# Patient Record
Sex: Female | Born: 1955 | Race: White | Hispanic: No | Marital: Married | Smoking: Former smoker
Health system: Southern US, Community
[De-identification: ages and names within clinical notes are randomized; demographics above are authoritative.]

## PROBLEM LIST (undated history)

## (undated) DIAGNOSIS — J45909 Unspecified asthma, uncomplicated: Secondary | ICD-10-CM

## (undated) DIAGNOSIS — F329 Major depressive disorder, single episode, unspecified: Secondary | ICD-10-CM

## (undated) DIAGNOSIS — I10 Essential (primary) hypertension: Secondary | ICD-10-CM

## (undated) DIAGNOSIS — F32A Depression, unspecified: Secondary | ICD-10-CM

## (undated) HISTORY — DX: Major depressive disorder, single episode, unspecified: F32.9

## (undated) HISTORY — DX: Unspecified asthma, uncomplicated: J45.909

## (undated) HISTORY — DX: Depression, unspecified: F32.A

## (undated) HISTORY — DX: Essential (primary) hypertension: I10

---

## 1998-05-01 ENCOUNTER — Emergency Department (HOSPITAL_COMMUNITY): Admission: EM | Admit: 1998-05-01 | Discharge: 1998-05-01 | Payer: Self-pay | Admitting: Emergency Medicine

## 2003-03-03 ENCOUNTER — Ambulatory Visit (HOSPITAL_COMMUNITY): Admission: RE | Admit: 2003-03-03 | Discharge: 2003-03-03 | Payer: Self-pay | Admitting: *Deleted

## 2003-03-03 ENCOUNTER — Encounter: Payer: Self-pay | Admitting: Family Medicine

## 2003-03-09 ENCOUNTER — Encounter: Payer: Self-pay | Admitting: Family Medicine

## 2003-03-09 ENCOUNTER — Ambulatory Visit (HOSPITAL_COMMUNITY): Admission: RE | Admit: 2003-03-09 | Discharge: 2003-03-09 | Payer: Self-pay | Admitting: Family Medicine

## 2003-05-01 ENCOUNTER — Encounter: Admission: RE | Admit: 2003-05-01 | Discharge: 2003-05-01 | Payer: Self-pay | Admitting: Family Medicine

## 2003-05-01 ENCOUNTER — Encounter: Payer: Self-pay | Admitting: Family Medicine

## 2005-03-28 ENCOUNTER — Ambulatory Visit: Payer: Self-pay | Admitting: Family Medicine

## 2005-04-13 ENCOUNTER — Ambulatory Visit: Payer: Self-pay | Admitting: Psychiatry

## 2005-05-01 ENCOUNTER — Ambulatory Visit (HOSPITAL_COMMUNITY): Admission: RE | Admit: 2005-05-01 | Discharge: 2005-05-01 | Payer: Self-pay | Admitting: Family Medicine

## 2005-05-03 ENCOUNTER — Ambulatory Visit: Payer: Self-pay | Admitting: Family Medicine

## 2005-05-09 ENCOUNTER — Ambulatory Visit (HOSPITAL_COMMUNITY): Admission: RE | Admit: 2005-05-09 | Discharge: 2005-05-09 | Payer: Self-pay | Admitting: Family Medicine

## 2005-05-17 ENCOUNTER — Ambulatory Visit: Payer: Self-pay | Admitting: Orthopedic Surgery

## 2005-05-30 ENCOUNTER — Ambulatory Visit: Payer: Self-pay | Admitting: Psychiatry

## 2005-06-14 ENCOUNTER — Ambulatory Visit: Payer: Self-pay | Admitting: Orthopedic Surgery

## 2005-08-07 ENCOUNTER — Inpatient Hospital Stay (HOSPITAL_COMMUNITY): Admission: RE | Admit: 2005-08-07 | Discharge: 2005-08-10 | Payer: Self-pay | Admitting: Orthopedic Surgery

## 2005-08-22 ENCOUNTER — Emergency Department (HOSPITAL_COMMUNITY): Admission: EM | Admit: 2005-08-22 | Discharge: 2005-08-22 | Payer: Self-pay | Admitting: Emergency Medicine

## 2005-09-15 ENCOUNTER — Ambulatory Visit: Payer: Self-pay | Admitting: Family Medicine

## 2005-09-26 ENCOUNTER — Emergency Department (HOSPITAL_COMMUNITY): Admission: EM | Admit: 2005-09-26 | Discharge: 2005-09-26 | Payer: Self-pay | Admitting: Emergency Medicine

## 2005-10-09 ENCOUNTER — Ambulatory Visit: Payer: Self-pay | Admitting: Family Medicine

## 2005-10-12 ENCOUNTER — Ambulatory Visit (HOSPITAL_COMMUNITY): Admission: RE | Admit: 2005-10-12 | Discharge: 2005-10-12 | Payer: Self-pay | Admitting: Family Medicine

## 2006-02-13 ENCOUNTER — Ambulatory Visit: Payer: Self-pay | Admitting: Family Medicine

## 2006-03-23 ENCOUNTER — Encounter (INDEPENDENT_AMBULATORY_CARE_PROVIDER_SITE_OTHER): Payer: Self-pay | Admitting: Family Medicine

## 2006-08-24 ENCOUNTER — Ambulatory Visit: Payer: Self-pay | Admitting: Family Medicine

## 2006-09-11 ENCOUNTER — Ambulatory Visit: Payer: Self-pay | Admitting: Family Medicine

## 2006-12-11 HISTORY — PX: REPLACEMENT TOTAL KNEE: SUR1224

## 2007-01-14 ENCOUNTER — Encounter: Payer: Self-pay | Admitting: Family Medicine

## 2007-01-14 DIAGNOSIS — E669 Obesity, unspecified: Secondary | ICD-10-CM | POA: Insufficient documentation

## 2007-01-14 DIAGNOSIS — F909 Attention-deficit hyperactivity disorder, unspecified type: Secondary | ICD-10-CM | POA: Insufficient documentation

## 2007-01-14 DIAGNOSIS — I10 Essential (primary) hypertension: Secondary | ICD-10-CM | POA: Insufficient documentation

## 2007-01-14 DIAGNOSIS — M25569 Pain in unspecified knee: Secondary | ICD-10-CM

## 2007-02-06 ENCOUNTER — Ambulatory Visit: Payer: Self-pay | Admitting: Family Medicine

## 2007-12-12 ENCOUNTER — Encounter: Payer: Self-pay | Admitting: Family Medicine

## 2007-12-26 ENCOUNTER — Ambulatory Visit: Payer: Self-pay | Admitting: Family Medicine

## 2008-01-02 ENCOUNTER — Ambulatory Visit (HOSPITAL_COMMUNITY): Admission: RE | Admit: 2008-01-02 | Discharge: 2008-01-02 | Payer: Self-pay | Admitting: Family Medicine

## 2008-03-06 ENCOUNTER — Encounter (HOSPITAL_COMMUNITY): Payer: Self-pay | Admitting: Obstetrics and Gynecology

## 2008-03-06 ENCOUNTER — Ambulatory Visit (HOSPITAL_COMMUNITY): Admission: RE | Admit: 2008-03-06 | Discharge: 2008-03-06 | Payer: Self-pay | Admitting: Obstetrics and Gynecology

## 2008-10-23 ENCOUNTER — Emergency Department (HOSPITAL_COMMUNITY): Admission: EM | Admit: 2008-10-23 | Discharge: 2008-10-23 | Payer: Self-pay | Admitting: Family Medicine

## 2008-10-27 ENCOUNTER — Inpatient Hospital Stay (HOSPITAL_COMMUNITY): Admission: EM | Admit: 2008-10-27 | Discharge: 2008-10-30 | Payer: Self-pay | Admitting: Emergency Medicine

## 2008-10-30 ENCOUNTER — Encounter: Payer: Self-pay | Admitting: Family Medicine

## 2008-11-20 ENCOUNTER — Encounter: Payer: Self-pay | Admitting: Family Medicine

## 2008-11-23 ENCOUNTER — Ambulatory Visit: Payer: Self-pay | Admitting: Family Medicine

## 2008-11-23 DIAGNOSIS — J320 Chronic maxillary sinusitis: Secondary | ICD-10-CM

## 2008-11-27 ENCOUNTER — Encounter: Payer: Self-pay | Admitting: Family Medicine

## 2009-04-06 ENCOUNTER — Encounter: Payer: Self-pay | Admitting: Family Medicine

## 2010-05-04 ENCOUNTER — Encounter (HOSPITAL_COMMUNITY): Admission: RE | Admit: 2010-05-04 | Discharge: 2010-06-03 | Payer: Self-pay | Admitting: Orthopedic Surgery

## 2010-12-31 ENCOUNTER — Encounter: Payer: Self-pay | Admitting: Family Medicine

## 2011-01-01 ENCOUNTER — Encounter: Payer: Self-pay | Admitting: Family Medicine

## 2011-01-10 NOTE — Letter (Signed)
Summary: misc.  misc.   Imported By: Curtis Sites 05/27/2010 13:15:02  _____________________________________________________________________  External Attachment:    Type:   Image     Comment:   External Document

## 2011-01-10 NOTE — Letter (Signed)
Summary: history and physical  history and physical   Imported By: Curtis Sites 05/27/2010 13:14:23  _____________________________________________________________________  External Attachment:    Type:   Image     Comment:   External Document

## 2011-01-10 NOTE — Letter (Signed)
Summary: demographic  demographic   Imported By: Curtis Sites 05/27/2010 13:14:00  _____________________________________________________________________  External Attachment:    Type:   Image     Comment:   External Document

## 2011-01-10 NOTE — Letter (Signed)
Summary: phone notes  phone notes   Imported By: Curtis Sites 05/27/2010 13:15:22  _____________________________________________________________________  External Attachment:    Type:   Image     Comment:   External Document

## 2011-01-10 NOTE — Letter (Signed)
Summary: consult  consult   Imported By: Curtis Sites 05/27/2010 13:12:26  _____________________________________________________________________  External Attachment:    Type:   Image     Comment:   External Document

## 2011-01-10 NOTE — Letter (Signed)
Summary: lab  lab   Imported By: Curtis Sites 05/27/2010 13:14:42  _____________________________________________________________________  External Attachment:    Type:   Image     Comment:   External Document

## 2011-01-10 NOTE — Letter (Signed)
Summary: progress notes  progress notes   Imported By: Curtis Sites 05/27/2010 13:17:16  _____________________________________________________________________  External Attachment:    Type:   Image     Comment:   External Document

## 2011-01-10 NOTE — Letter (Signed)
Summary: xray  xray   Imported By: Curtis Sites 05/27/2010 13:17:36  _____________________________________________________________________  External Attachment:    Type:   Image     Comment:   External Document

## 2011-04-25 NOTE — Op Note (Signed)
NAMESKYLLAR, [name]              ACCOUNT NO.:  [id number]   MEDICAL RECORD NO.:  [id number]          PATIENT TYPE:  AMB   LOCATION:  SDC                           FACILITY:  [facility]   PHYSICIAN:  Zelphia Cairo, MD    DATE OF BIRTH:  [birth year]   DATE OF PROCEDURE:  03/06/2008  DATE OF DISCHARGE:                               OPERATIVE REPORT   PREOPERATIVE DIAGNOSIS:  Abnormal uterine bleeding, endometrial mass.   POSTOPERATIVE DIAGNOSIS:  Abnormal uterine bleeding, endometrial mass.  Path pending.   PROCEDURE:  Hysteroscopy, D&C.   SURGEON:  Zelphia Cairo, MD.   ANESTHESIA:  General.   SPECIMEN:  Endometrial curettings, polyp.   FLUID DEFICIT:  10 mL.   ESTIMATED BLOOD LOSS:  Minimal.   COMPLICATIONS:  None.   CONDITION:  Stable to recovery room.   PROCEDURE:  The patient was taken to the operating room, where general  anesthesia was found to be adequate.  She was placed in the dorsal  lithotomy position using Allen stirrups.  She was prepped and draped in  a sterile fashion and an in-and-out catheter was used to drain her  bladder for approximately 20 mL of clear urine.  A speculum was placed  in the vagina and a single-tooth tenaculum was placed on the anterior  lip of the cervix.  The cervix was serially dilated with Shawnie Pons dilators.  The diagnostic hysteroscope was inserted into the uterine cavity and a  survey was performed.  Bilateral ostia appeared normal.  There was a  polypoid mass on the left uterine wall.  The remainder of the  endometrial cavity appeared atrophic without lesions or masses.  The  hysteroscope was then removed and a gentle curetting was performed.  The  hysteroscope was reinserted.  The endometrial mass was found to be  absent.  The single-tooth tenaculum and speculum were removed and the  patient was taken to the recovery room.      Zelphia Cairo, MD  Electronically Signed     GA/MEDQ  D:  03/06/2008  T:  03/06/2008  Job:   [id number]

## 2011-04-25 NOTE — H&P (Signed)
NAME:  YACHET, MATTSON NO.:  [id number]   MEDICAL RECORD NO.:  [id number]          PATIENT TYPE:  AMB   LOCATION:  SDC                           FACILITY:  [facility]   PHYSICIAN:  Zelphia Cairo, MD    DATE OF BIRTH:  [birth year]   DATE OF ADMISSION:  DATE OF DISCHARGE:                              HISTORY & PHYSICAL   A 55 year old female who presented to the office with complaints of  postmenopausal bleeding.  She had not had a period for 3 years until  January 2009, when she had vaginal bleeding lasting 5 days that was very  heavy and associated with cramping.  In 2005, she stopped her menstrual  cycles and started having hot flashes and night sweats.  She was not  started on hormone replacement therapy.  Further evaluation in the  office with sonohystogram reveals normal appearing ovaries, a 2 cm  subserosal fibroid, and a 2.8 cm polypoid mass within the uterine  cavity.   PAST MEDICAL HISTORY:  1. History of hepatitis A.  2. Irritable bowel syndrome.  3. Arthritis.  4. Hypertension.   SURGICAL HISTORY:  Left knee replacement in 2007.   MEDICATIONS:  Include Strattera and Paxil.   ALLERGIES:  PENICILLIN,   SOCIAL HISTORY:  Includes smoking 1/4 pack a day and occasional social  alcohol use.  She is married, and her primary physician is Dr. Syliva Overman.   FAMILY HISTORY:  Negative for gyn, colon, and breast cancer.   PHYSICAL EXAMINATION:  VITAL SIGNS:  Height 5 feet 2 inches, weight 256,  blood pressure 140/72, hemoglobin 13.4.  HEAD/NECK:  Normal.  No thyromegaly or nodularity.  HEART:  Regular rate and rhythm.  LUNGS:  Clear bilaterally.  ABDOMEN:  Soft, obese, nontender.  PELVIC:  Shows normal external female genitalia and urethral meatus.  Vagina and cervix are normal.  No lesions.  Uterus is mobile and  nontender.  No adnexal masses, but bimanual exam is compromised by body  habitus.  EXTREMITIES:  Without clubbing, cyanosis, or edema.   ASSESSMENT AND PLAN:  Postmenopausal bleeding, with endometrial mass  suspected by sonohystogram.  Our plan is for an hysteroscopy, D&C, with  resection of endometrial mass.      Zelphia Cairo, MD  Electronically Signed     GA/MEDQ  D:  03/05/2008  T:  03/06/2008  Job:  [id number]

## 2011-04-25 NOTE — Discharge Summary (Signed)
[name], Erin NO.:  Velez   MEDICAL RECORD NO.:  [id number]          PATIENT TYPE:  INP   LOCATION:  6739                         FACILITY:  [facility]   PHYSICIAN:  Erin Ramus, MD       DATE OF BIRTH:  [birth year]   DATE OF ADMISSION:  10/27/2008  DATE OF DISCHARGE:  10/30/2008                               DISCHARGE SUMMARY   PRIMARY CARE DOCTOR:  Milus Mallick. Lodema Hong, MD   PRIMARY DISCHARGE DIAGNOSIS:  Asthma exacerbation.   SECONDARY DIAGNOSES:  1. Community-acquired pneumonia.  2. Hypertension.  3. Metabolic syndrome with elevated blood glucose from prednisone, but      no evidence of diabetes.  4. Obesity.  5. Irritable bowel syndrome.  6. Anemia.  7. Transient hyponatremia and hyperkalemia.   HOSPITAL COURSE:  1. Asthma exacerbation.  The patient is a 55 year old female who was      admitted secondary to increasing cough and shortness of breath.      The patient is status post antibiotic treatment as an outpatient.      She did have persistent bilateral wheezes and she was placed on      prednisone with rapid taper.  She was given inhalers.  Her symptoms      have improved dramatically.  She was co-treated for pneumonia.  She      will be on antibiotics for another 5 days.  In fact her chest x-ray      did show evidence of lobular pneumonia.  The patient will require      oxygen at discharge until her symptoms have completely resolved.      The patient did have an 88% sat with ambulation in the hospital and      will be sent home with home O2.  2. Hypertension.  This is stable.  The patient is currently on      enalapril, this should be continue postdischarge.  Because of her      electrolyte abnormalities, I do not recommend continue in diuretic      therapy.  3. Transient hyponatremia and hyperkalemia.  This is more likely      secondary to diuretic usage and this has been discontinued.  4. Irritable bowel syndrome.  This has been stable  throughout this      admission.  5. Obesity.  The patient has a normal TSH and free T4 and she has been      counseled in respect of weight loss.  6. Anemia.  The patient did have rather profound anemia with a      hemoglobin of 7.7.  She received 2 units of blood in transfusion.      Her hemoglobin and hematocrit have been stable x2 days and believe      that no further workup of this is needed.  The patient did have      iron studies done.  She did have an active retic count and has      adequate iron for erythropoiesis and does not require iron      supplements, more than  likely this is secondary to postmenopausal      bleeding and endometrial hyperplasia for which she has been worked      up in the past.  7. Hyperglycemia.  The patient did have hemoglobin A1c checked, but      had no evidence of overt diabetes; however, given her weight, she      is more than likely suffering from metabolic syndrome and this will      need to be monitored by her primary care physician.   LABS OF NOTE:  1. Initial leukocytosis of 14.4, decreasing to 10.39, and then after      initiation of steroid, she did have rebound leukocytosis at 12.4.  2. Initial hemoglobin was 9.9, hematocrit 29.  This decreased to      hemoglobin of 7.7, hematocrit of 23.7 after IV fluids.  She did      receive 2 units of packed RBCs with hemoglobin of 9.5 and a      subsequent hemoglobin of 10.1.  The patient has shown no signs of      active bleeding post.  3. Platelets of 403.  4. Retic count of 3.1%.  5. Hyperkalemia with potassium of 6.1.  Her initial intake potassium      was 6.9.  I am recheck that to see if this persists.  Her initial      sodium was 134, this has been corrected to 138.  6. Glucose initially 142, increasing to 179 with steroids, but a      hemoglobin A1c of 5.8.  7. CK of 363 with a troponins 0.02 and 0.01 respectively.  The patient      did have episode of chest pain.  This prompted the CK and  troponin      check.  She also had two negative EKGs.  8. TSH of 1.66 with a free T4 of 0.97.  9. Iron studies showing a ferritin of 608 with a serum iron of 39.  10.UA showing initially concentrated urine with specific gravity      greater than 1.028, decreasing to 1.018.  Otherwise, no evidence of      infection.   STUDIES:  Chest x-ray showing probable right lower lobe pneumonia.   MEDICATIONS ON DISCHARGE:  1. Paxil 20 mg p.o. daily.  2. Vaseretic 10/25, which has been discontinued.  3. Ciprofloxacin 500 mg one p.o. b.i.d. x5 days.  4. Prednisone 40 mg p.o. daily x3 days, 20 mg p.o. daily x3 days, 10      mg p.o. daily x3 days.  5. Albuterol 1-2 puffs inhale q.6 h. p.r.n. wheezes.  6. Vicodin 5/325 one to two p.o. q.6 h. p.r.n. pain.  7. Home O2 at 2 liters per minute to maintain sats greater than 90%.   There is one lab pending, but this will be reviewed prior to discharge  in regards to her potassium.  Otherwise, she has no labs or studies  pending at time of discharge.  The patient is in stable condition and  anxious for discharge.  Time spent 35 minutes.      Erin Ramus, MD  Electronically Signed     JF/MEDQ  D:  10/30/2008  T:  10/30/2008  Job:  [id number]   cc:   Milus Mallick. Lodema Hong, M.D.

## 2011-04-25 NOTE — H&P (Signed)
[name], [name]              ACCOUNT NO.:  [id number]   MEDICAL RECORD NO.:  [id number]          PATIENT TYPE:  INP   LOCATION:  1825                         FACILITY:  [facility]   PHYSICIAN:  [name], M.D.   DATE OF BIRTH:  04/15/1956   DATE OF ADMISSION:  10/27/2008  DATE OF DISCHARGE:                              HISTORY & PHYSICAL   CHIEF COMPLAINT:  Coughing and shortness of breath.   HISTORY OF PRESENT ILLNESS:  The patient is a 55 year old white female  that presented at the emergency room secondary to cough and shortness of  breath.  The patient stated that five days ago she was feeling bad.  She  went to her primary care doctor, Dr. Lodema Velez.  She was given a Z-pack.  She stated that she was not feeling better.  She came here to the  emergency room.  She was given another Z-pack.  She continues not to  feel well, so she came back to the emergency room today.  She states  that she continues to cough, she continues to run low grade fever, and  she coughed up x1 blood-tinged sputum.  Nausea and vomiting x1.  She  states aching all over.  She is having chills.  She states that she  feels a little better since being in the emergency room.   PAST MEDICAL HISTORY:  1. Significant for irritable bowel syndrome.  2. Arthritis.  3. Obesity.  4. Hypertension.   FAMILY HISTORY:  Her father died of multiple myeloma.  He was 72.  Her  mother had fibromyalgia, arthritis, and high cholesterol.   SOCIAL HISTORY:  The patient is married.  She has two children.  She  used to smoke half a pack per day but quit for the past month.  She  drinks socially.   MEDICATIONS:  1/  She takes a blood pressure medication.  1. Paxil 20 mg daily.   ALLERGIES:  PENICILLIN.   REVIEW OF SYSTEMS:  Negative, otherwise as in HPI.   PHYSICAL EXAMINATION:  VITAL SIGNS:  Temperature 100.5, blood pressure  144/81, pulse 99, respirations 22, pulse ox 95% on room air.  GENERAL:  The patient is  sitting up on the stretcher, does not seem to  be in any acute distress.  She is coughing.  HEENT:  Normocephalic, atraumatic.  Pupils equal, round, and reactive to  light without erythema.  CARDIOVASCULAR:  She is regular rate and rhythm.  LUNGS:  Mostly clear bilaterally.  ABDOMEN:  Soft, obese, positive bowel sounds.  EXTREMITIES:  Without edema, 2+ DP pulses bilaterally.  Her right lower  extremity looks a little muscled, but she does have good cap refill and  good posterior tibial pulse.   LABORATORY DATA:  Sodium 134, potassium 6.9, chloride 105, BUN 30,  creatinine 0.6, glucose 142.  Hemoglobin 9.9. hematocrit 29.   Chest x-ray:  Probably right lower lobe pneumonia.   ASSESSMENT/PLAN:  1. Pneumonia.  2. Hypertension.  3. Hyponatremia.  4. Hyperkalemia.  5. Irritable bowel syndrome.  6. Arthritis.  7. Obesity.   Will admit the patient to the  hospital, IV fluids, normal saline at 50  an hour, put on __________precautions.  Start Rocephin, azithromycin,  and Tamiflu.  Rectify blood pressure with p.r.n. Lopressor until her  family brings her medication in.  Will continue Paxil.  Will check EKG  with stat repeat of her potassium level.  Will check guaiac stool x3 for  her anemia.  Will check CMP and CBC.  If her potassium is high, then  will treat her hyperkalemia, also have her on tele secondary to the  potassium being elevated on her labs.      [name], M.D.  Electronically Signed     NJ/MEDQ  D:  10/27/2008  T:  10/27/2008  Job:  [id number]   cc:   [name]. [name], M.D.

## 2011-04-28 NOTE — Op Note (Signed)
[name], GARDINER              ACCOUNT NO.:  [id number]   MEDICAL RECORD NO.:  [id number]          PATIENT TYPE:  AMB   LOCATION:  DFTL                         FACILITY:  [facility]   PHYSICIAN:  Mila Homer. Sherlean Foot, M.D. DATE OF BIRTH:  19-Feb-1956   DATE OF PROCEDURE:  08/07/2005  DATE OF DISCHARGE:                                 OPERATIVE REPORT   PREOPERATIVE DIAGNOSIS:  Left knee osteoarthritis.   POSTOPERATIVE DIAGNOSIS:  Left knee osteoarthritis.   OPERATION PERFORMED:  Left total knee arthroplasty with computer assistance.   SURGEON:  Mila Homer. Sherlean Foot, M.D.   ASSISTANT:  Legrand Pitts. Duffy, P.A.   ANESTHESIA:  General.   INDICATIONS FOR PROCEDURE:  The patient is a 55 year old white female with  failure of conservative measures for osteoarthritis of the knee.  Informed  consent was obtained.   DESCRIPTION OF PROCEDURE:  The patient was laid supine and administered  general anesthesia and preoperative femoral nerve block.  The left lower  extremity was prepped and draped in the usual sterile fashion.  The #10  blade was used to make a midline incision and parapatellar arthrotomy.  The  deep MCL was elevated off the medial crest of the tibia.  Synovectomy was  performed.  I then went into flexion, released the ACL and PCL. I everted  the patella in extension, measured it 22 mm thick, reamed down to 13 mm with  a reamer, removed excess bone, drilled three lug holes with the 29 template  and with the 29 prosthesis in place, it was 22 mm as well.  I removed the  trial prosthesis and at this point inserted my computer assistance pins.  Then I calibrated the computer.  I then set up the extramedullary alignment  guide on the tibia once I had pinned it into place where I thought would  make a good cut, I put the computer assisted angel wing in the slot and it  confirmed an absolute 0.0 degree varus valgus cut in 5 degrees of flexion.  I made the cut with a sagittal saw and removed  the extramedullary guide and  the bone piece.  I then made an intramedullary drill hole in the femur, put  the distal femoral cutting block set on 6 degree valgus.  I pinned it into  place, then put the computer assisted angel wing in the slot and it also  verified a 0.1 valgus cut in about 5 degrees of flexion.  I then made this  cut with a sagittal saw.  I then drilled out the epicondylar axis and after  measuring a size D femoral component, pinning it place, verified that we  were doing 3 degrees in external rotation to the epicondylar axis.  I then  made the anterior posterior and chamfer cuts with the 4 in 1 cutting block.  I then again verified this with the computerized angel wing.  I then turned  my attention to the tibia, used a lamina spreader and removed medial and  lateral menisci, posterior condylar osteophytes, ACL, PCL.  I then put the  12 mm spacer block  in place and with the computer, looked at my ligament  balance.  It showed a little bit more, a degree more laxity in the lateral  compartment and a degree in the medial compartment in extension.  I then  removed the computer pins and finished the femur with a size B finishing  block drill and saw, finished the tibia with a size 3 tibial tray drill and  keel.  Trial with a size D femur, size 3 tibia, size 12 insert and 29  patella.  I had flexion extension gap balance.  Drop and dangle was back to  125 degrees.  I then removed the trial components, copiously irrigated and  mixed the cement.  I then cemented in the components and removed excess  cement, let the tourniquet down at one hour and 10 minutes.  I then left the  Hemovac deep to the arthrotomy, closed the arthrotomy with interrupted  figure-of-eight #1 Vicryl sutures.  I then put in a Painbuster pain pump in  the subcuticular space and closed with interrupted 0 Vicryl sutures, running  2-0 Vicryl stitch and skin staples.   COMPLICATIONS:  None.   DRAINS:  One  Hemovac, one Painbuster pump.   DRESSING:  Adaptic, 4 x 4s, ABDs, sterile Webril and TED stocking.           ______________________________  Mila Homer. Sherlean Foot, M.D.     SDL/MEDQ  D:  08/07/2005  T:  08/07/2005  Job:  [id number]

## 2011-04-28 NOTE — H&P (Signed)
NAMESHAWNIECE, OYOLA              ACCOUNT NO.:  [id number]   MEDICAL RECORD NO.:  [id number]          PATIENT TYPE:  INP   LOCATION:  NA                           FACILITY:  [facility]   PHYSICIAN:  Mila Homer. Sherlean Foot, M.D. DATE OF BIRTH:  07-29-1956   DATE OF ADMISSION:  08/07/2005  DATE OF DISCHARGE:                                HISTORY & PHYSICAL   CHIEF COMPLAINT:  Bilateral knee pain.   HISTORY OF PRESENT ILLNESS:  The patient is a 55 year old white female with  a history of bilateral knee pain for many years.  Currently, the left knee  is significantly worse than the right.  Over the last several years, it has  progressively worsened but significantly worsened since November of 2005.  She has a very sharp, stabbing-type pain centrally within the knee that  worsens with ambulation and range of motion.  She states she is having to  drag her leg around due to pain and difficulty with moving it.  The pain  does radiate up and down the entire leg at times.  She does have swelling.  She does have significant grinding.  She is currently using a cane to assist  in ambulation.  X-rays show severe tricompartment OA, left knee.   ALLERGIES:  PENICILLIN.   CURRENT MEDICATIONS:  1.  Ultracet one to two every four to six hours.  2.  Paroxetine 20 mg p.o. daily.  3.  Hydrochlorothiazide 25 mg p.o. daily.   PAST MEDICAL HISTORY:  1.  Hypertension.  2.  Depression.  3.  Obesity.   PAST SURGICAL HISTORY:  Tonsillectomy at 55 years of age without any  complications.   SOCIAL HISTORY:  This is a 55 year old obese white female who denies any  history of smoking.  She indicates that she stopped drinking when she  started her antidepressant.  She is married and has several grown children.  She lives in a [place] house, and she is currently previously employed  with [employer].   PRIMARY CARE PHYSICIAN:  Milus Mallick. Lodema Hong, M.D.   FAMILY HISTORY:  Mother is alive with just  osteoarthritis.  Father is alive  with hypertension.  One brother alive with no significant medical history.  Two sisters alive, one with scleroderma.   REVIEW OF SYSTEMS:  Negative for all review of systems categories, including  general, sensory, respiratory, cardiac, GI, GU, hematologic,  musculoskeletal.   PHYSICAL EXAMINATION:  VITAL SIGNS:  Height 5 feet 3 inches, weight 220  pounds, blood pressure 138/90, pulse 80 and regular, respirations 12, and  the patient is afebrile.  GENERAL:  This is a 55 year old white female who appears to be significantly  obese.  She ambulates with a cane in her right hand and walks with a  significant limp with slow gait.  It is difficult for her to get on an off  of the exam table, but she appears to be in no significant distress.  HEENT:  Head was normocephalic.  Pupils equal, round, reactive to light and  accommodation.  Extraocular movements intact.  Oral buccal mucosa was pink  and  moist.  External ears without deformities.  Gross hearing intact.  NECK:  Supple.  No palpable lymphadenopathy.  Thyroid region was nontender.  She had good range of motion of her cervical spine.  CHEST:  Lung sounds were clear and equal bilaterally.  No wheezing, rhonchi,  or rales.  HEART:  Regular rate and rhythm.  No murmurs, rubs, or gallops.  ABDOMEN:  Round, obese, soft, nontender.  Normoactive bowel sounds.  No  hepatosplenomegaly palpable due to obesity.  CVA region nontender.  EXTREMITIES:  Upper extremities were symmetrical in size and shape, with  full range of motion of her shoulders, elbows, and wrists.  She had  Dupuytren's contractures in the little fingers of both hands.  Lower  extremities:  Right and left hip had full extension, flexion up to 130  degrees with 20 degrees internal and external rotation of the hips without  any mechanical symptoms or discomfort.  Right knee was without any signs of  erythema or ecchymosis.  She had no effusion at  this time.  She was  generally sore throughout the knee.  She had full extension.  She was able  to flex it back to about 100 degrees.  She had a slight valgus/varus laxity.  She had coarse crepitus on the knee.  No effusion.  The calf was soft and  nontender.  The left knee had no signs of erythema or ecchymosis but  possibly a trace effusion.  She was tender throughout.  She did have some  slight valgus/varus laxity.  She was able to slowly fully extend and slowly  flex it back to about 110 degrees.  She did have significant crepitus on the  patella.  The calf was soft and nontender.  The ankles with symmetric, with  good dorsiplantarflexion.  PERIPHERAL VASCULAR:  Carotid pulses were 2+, no bruits.  Radial pulses were  2+.  Dorsalis pedis pulses were 1+.  She had no lower extremity edema or  venostasis changes.  NEUROLOGIC:  The patient was conscious, alert, and appropriate.  __________  conversation __________.  She had no gross neurologic defects noted.  BREASTS, RECTAL, GU:  Exams deferred at this time.   IMPRESSION:  1.  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  2.  Hypertension.  3.  Depression.  4.  Obesity.   PLAN:  The patient will be admitted to [facility] on August 07, 2005 for a planned left total knee arthroplasty by Dr. Sherlean Foot.  The patient  will undergo all of the routine labs and tests prior to this surgical  procedure.      Jamelle Rushing, P.A.    ______________________________  Mila Homer. Sherlean Foot, M.D.    RWK/MEDQ  D:  07/27/2005  T:  07/27/2005  Job:  [id number]

## 2011-04-28 NOTE — Discharge Summary (Signed)
[name], [name]              ACCOUNT NO.:  [id number]   MEDICAL RECORD NO.:  [id number]          PATIENT TYPE:  INP   LOCATION:  5010                         FACILITY:  [facility]   PHYSICIAN:  Mila Homer. Sherlean Foot, M.D. DATE OF BIRTH:  Mar 07, 1956   DATE OF ADMISSION:  08/07/2005  DATE OF DISCHARGE:  08/10/2005                                 DISCHARGE SUMMARY   ADMISSION DIAGNOSES:  1.  Bilateral knee pain, currently left greater than right.  2.  Hypertension.  3.  Depression.  4.  Obesity.   DISCHARGE DIAGNOSES:  1.  Status post left total knee arthroplasty.  2.  Acute blood loss of anemia secondary to surgery, asymptomatic.  3.  Hypertension.  4.  Depression.  5.  Obesity.   HISTORY OF PRESENT ILLNESS:  [name] is a 55 year old white female with  history of bilateral knee pain for many years.  Currently the left knee is  significantly worse than the right.  Over the last several years the pain  has become gradually worse in the left knee.  She describes the pain as very  sharp, stabbing pain located centrally within the knee that worsens with  ambulation and range of motion.  She states she is having to drag her leg  around due to pain and has difficulty moving.  The pain radiates up and down  the entire leg and at times she uses a cane to ambulate.  X-rays of the left  knee show severe tricompartment loss due to arthritis.   ALLERGIES:  PENICILLIN.   MEDICATIONS:  1.  Ultracet 1-2 tablets every 4-6 hours for pain.  2.  __________ 20 mg p.o. daily.  3.  Hydrochlorothiazide 25 mg p.o. daily.   PROCEDURE:  The patient was taken to the operating room on August 07, 2005  by Dr. Georgena Spurling, assisted by Nathanial Rancher, P.A.  The patient was placed  under general anesthesia with a preoperative femoral nerve block.  The  patient then underwent a left total knee arthroplasty using the following  components:  A size D femur, a size 3 tibial component, 12 mm bearing and  then  all poly size 29,8.0 mm thickness.  The patient tolerated the procedure  well and returned to recovery in good, stable condition.   HOSPITAL COURSE:  On postoperative day #1 the patient had poor pain control.  Otherwise tolerating diet.  No chest pain, shortness of breath, nausea, or  vomiting.  T-max was 100.2.  Vital signs stable.  H&H 9.7/28.7.  OxyContin  10 mg sustained release was to be started after lunch if the patient  tolerated the diet well and a.c.  The patient's acute blood loss of anemia  was asymptomatic, so it was decided to be monitored.  On postoperative day  #2 the patient had better pain control.  Afebrile, vital signs stable.  H&H  dropped to 8.8 and 26.1.  The patient remained asymptomatic and this was to  be monitored.  On exam the patient's abdomen was distended and bowel sounds  were hypoactive.  KUB was ordered to rule out obstruction.  Postoperative  day #3 the patient was afebrile, vital signs stable.  H&H was 8.1/23.1.  Patient with no symptoms of anemia.  Abdominal exam revealed positive bowel  sounds.  The patient's abdomen was nontender, decreased distention.  KUB  showed colonic obstruction and constipation.  Dulcolax 10 mg p.r. x1 was  ordered.  The patient progressed well with physical therapy and it was felt  that she could be discharged later in the afternoon after BM.   LABORATORY DATA:  Routine labs on admission:  CBC:  All values within normal  limits.  Coags:  All values within normal limits.  Routine chemistries on  admission:  Sodium 140, potassium 4.5, chloride 105, bicarb 24, glucose 105,  BUN 12, creatinine 0.7, calcium 9.7.   Hepatic enzymes on admission:  All values within normal limits.  Urinalysis  on admission was negative.   Two view chest x-ray dated August 01, 2005 showed no active cardiopulmonary  disease.  Two view abdomen on August 09, 2005 showed colonic ileus and  constipation.   EKG on admission showed normal sinus rhythm.   PR interval was 138 msec, PRT  axis 33.9, __________ .   DISCHARGE INSTRUCTIONS:   MEDICATIONS:  The patient was to resume all home medications except for  Ultracet and add the following:  1.  Lovenox 40 mg one injection daily at 8 a.m. x11 days, __________ .  2.  OxyContin 10 mg one tablet every 12 hours p.r.n. pain.  3.  Dilaudid 4 mg one tablet every four hours for breakthrough pain.  4.  Robaxin 500 mg 1-2 tablets every 6-8 hours for spasm.  5.  Trinsicon one capsule three times daily x28 days.  6.  Pick up over the counter stool softener and laxative as needed.   DIET:  No restrictions.   ACTIVITY:  The patient is to be weight-bearing as tolerated with walker.   WOUND CARE:  The patient is to change the dressing and may shower after two  days if no drainage at the wound site.  The patient is to call the office at  [phone] if any signs of infection or pain is not under adequate control.   SPECIAL INSTRUCTIONS:  CPM 0-90 degrees 6-8 hours a day.  Increase by 10  degrees daily.  Home health PT per [facility].   FOLLOW UP:  The patient needs to follow up with Dr. Sherlean Foot approximately 11  days from day of surgery.  The patient is to call for appointment at [phone].  The patient is to follow up with primary care physician in 2-3 weeks  to check hemoglobin.  Primary care physician is Dr. Syliva Overman.      Richardean Canal, P.A.    ______________________________  Mila Homer. Sherlean Foot, M.D.    GC/MEDQ  D:  10/23/2005  T:  10/23/2005  Job:  [id number]   cc:   Mila Homer. Sherlean Foot, M.D.  Fax: [phone]   Milus Mallick. Lodema Hong, M.D.  Fax: [phone]

## 2011-09-04 LAB — BASIC METABOLIC PANEL
BUN: 11
CO2: 28
Calcium: 9.7
Chloride: 100
Creatinine, Ser: 0.43
GFR calc Af Amer: >60
GFR calc non Af Amer: >60
Glucose, Bld: 107 — ABNORMAL HIGH
Potassium: 4.2
Sodium: 136

## 2011-09-04 LAB — TYPE AND SCREEN: Antibody Screen: NEGATIVE

## 2011-09-04 LAB — CBC
HCT: 40.7
Hemoglobin: 13.9
MCHC: 34.3
MCV: 89.5
Platelets: 363
RBC: 4.55
RDW: 14.2
WBC: 11.7 — ABNORMAL HIGH

## 2011-09-12 LAB — COMPREHENSIVE METABOLIC PANEL
ALT: 17
ALT: 22
AST: 30
AST: 33
Alkaline Phosphatase: 95
BUN: 8
CO2: 24
CO2: 27
CO2: 28
Calcium: 8.5
Calcium: 9
Chloride: 107
Creatinine, Ser: 0.46
GFR calc Af Amer: >60
GFR calc Af Amer: >60
GFR calc non Af Amer: >60
GFR calc non Af Amer: >60
GFR calc non Af Amer: >60
Glucose, Bld: 118 — ABNORMAL HIGH
Potassium: 3.4 — ABNORMAL LOW
Potassium: 3.7
Sodium: 140
Sodium: 141

## 2011-09-12 LAB — CROSSMATCH: DAT, IgG: NEGATIVE

## 2011-09-12 LAB — CBC
HCT: 23.7 — ABNORMAL LOW
Hemoglobin: 10.1 — ABNORMAL LOW
Hemoglobin: 7.7 — CL
Hemoglobin: 9.1 — ABNORMAL LOW
Hemoglobin: 9.5 — ABNORMAL LOW
MCHC: 35.4
Platelets: 297
RBC: 2.61 — ABNORMAL LOW
RBC: 2.92 — ABNORMAL LOW
RDW: 13.9
RDW: 15.3
WBC: 16.8 — ABNORMAL HIGH

## 2011-09-12 LAB — URINALYSIS, ROUTINE W REFLEX MICROSCOPIC
Glucose, UA: 100 — AB
Hgb urine dipstick: NEGATIVE
Ketones, ur: NEGATIVE
Leukocytes, UA: NEGATIVE
Nitrite: NEGATIVE
Nitrite: NEGATIVE
Protein, ur: 30 — AB
Specific Gravity, Urine: 1.018
Urobilinogen, UA: 0.2
Urobilinogen, UA: 1
pH: 6

## 2011-09-12 LAB — BASIC METABOLIC PANEL
GFR calc Af Amer: >60
GFR calc non Af Amer: >60
Glucose, Bld: 187 — ABNORMAL HIGH
Potassium: 4.4
Sodium: 140

## 2011-09-12 LAB — RETICULOCYTES
RBC.: 2.92 — ABNORMAL LOW
Retic Count, Absolute: 91

## 2011-09-12 LAB — CULTURE, BLOOD (ROUTINE X 2): Culture: NO GROWTH

## 2011-09-12 LAB — HEMOGLOBIN A1C
Hgb A1c MFr Bld: 5.8
Mean Plasma Glucose: 120

## 2011-09-12 LAB — DIFFERENTIAL
Basophils Absolute: 0
Lymphocytes Relative: 9 — ABNORMAL LOW
Lymphs Abs: 1.5
Monocytes Absolute: 0.8
Neutro Abs: 14.4 — ABNORMAL HIGH

## 2011-09-12 LAB — POCT I-STAT, CHEM 8
BUN: 13
Calcium, Ion: 1.01 — ABNORMAL LOW
Chloride: 105
HCT: 29 — ABNORMAL LOW
Sodium: 134 — ABNORMAL LOW

## 2011-09-12 LAB — FOLATE: Folate: 10.6

## 2011-09-12 LAB — VITAMIN B12: Vitamin B-12: 381 (ref 211–911)

## 2011-09-12 LAB — FERRITIN: Ferritin: 608 — ABNORMAL HIGH (ref 10–291)

## 2011-09-12 LAB — HEMOGLOBIN AND HEMATOCRIT, BLOOD
HCT: 23.4 — ABNORMAL LOW
Hemoglobin: 8.1 — ABNORMAL LOW

## 2011-09-12 LAB — CARDIAC PANEL(CRET KIN+CKTOT+MB+TROPI): CK, MB: 4

## 2011-09-12 LAB — TSH: TSH: 1.662

## 2011-09-12 LAB — CK: Total CK: 363 — ABNORMAL HIGH

## 2011-09-12 LAB — IRON AND TIBC
Saturation Ratios: 16 — ABNORMAL LOW
TIBC: 250

## 2011-09-12 LAB — URINE MICROSCOPIC-ADD ON

## 2013-07-05 ENCOUNTER — Encounter: Payer: Self-pay | Admitting: *Deleted

## 2013-07-07 ENCOUNTER — Encounter: Payer: Self-pay | Admitting: Family Medicine

## 2013-07-07 ENCOUNTER — Ambulatory Visit (INDEPENDENT_AMBULATORY_CARE_PROVIDER_SITE_OTHER): Payer: BC Managed Care – PPO | Admitting: Family Medicine

## 2013-07-07 VITALS — BP 150/90 | HR 80 | Wt 255.0 lb

## 2013-07-07 DIAGNOSIS — E669 Obesity, unspecified: Secondary | ICD-10-CM

## 2013-07-07 DIAGNOSIS — M25569 Pain in unspecified knee: Secondary | ICD-10-CM

## 2013-07-07 DIAGNOSIS — I10 Essential (primary) hypertension: Secondary | ICD-10-CM

## 2013-07-07 DIAGNOSIS — F909 Attention-deficit hyperactivity disorder, unspecified type: Secondary | ICD-10-CM

## 2013-07-07 DIAGNOSIS — Z79899 Other long term (current) drug therapy: Secondary | ICD-10-CM

## 2013-07-07 DIAGNOSIS — M25561 Pain in right knee: Secondary | ICD-10-CM

## 2013-07-07 DIAGNOSIS — R5381 Other malaise: Secondary | ICD-10-CM

## 2013-07-07 MED ORDER — DICLOFENAC SODIUM 75 MG PO TBEC: 75.0000 mg | DELAYED_RELEASE_TABLET | Freq: Two times a day (BID) | ORAL | Status: DC

## 2013-07-07 MED ORDER — LOSARTAN POTASSIUM 50 MG PO TABS: 50.0000 mg | ORAL_TABLET | Freq: Every day | ORAL | Status: DC

## 2013-07-07 NOTE — Progress Notes (Signed)
  Subjective:    Patient ID: [name], female    DOB: 29-Aug-1956, 57 y.o.   MRN: [id number]  HPI Patient has not been seen for nearly 3 years. Was on blood pressure medicine. Stop enalapril because she thought it was making her urinate often. Not back on any bp mrd yet.  Knee pain terrible. Can not hardly walk. History of left knee replacement. Strong family history of arthritis.  Swells up with exercise. Worse with motion  Off anti depressant med doing ok with that. However states having very significant all difficulty with attention and focusing. Patient would like to consider medication for that.  Currently not exercising.    Review of Systems No chest pain no abdominal pain gradual weight gain some fatigue no headaches ROS otherwise negative    Objective:   Physical Exam Alert HEENT normal. Lungs clear. Heart regular rate and rhythm. Right knee impressive crepitations. Blood pressure on repeat 144/94 ankles trace edema       Assessment & Plan:  Impression 1 hypertension suboptimal in control. #2 progressive right knee arthritis. #3 excessive weight gain and fatigue. #4 potential ADHD. Plan appropriate blood work. Initiate losartan 50 daily. Initiate Voltaren 75 twice a day. Recheck in one month. Diet exercise discussed. WSL

## 2013-07-09 ENCOUNTER — Telehealth: Payer: Self-pay | Admitting: Family Medicine

## 2013-07-09 NOTE — Telephone Encounter (Signed)
Pt called back stating she called [facility] to discuss the generic forms and what would be best for the Dr to re-order for her. [facility] told her to inform her Dr that he can just look online at their website to see the generic ($4) forms of meds at her doctors leisure and then re-order. Just FYI

## 2013-07-09 NOTE — Telephone Encounter (Signed)
pts script for Cozar was too expensive, can we switch to a cheaper med that is the same as this cozar,  [facility]

## 2013-07-10 MED ORDER — TRIAMTERENE-HCTZ 37.5-25 MG PO CAPS: 1.0000 | ORAL_CAPSULE | ORAL | Status: DC

## 2013-07-10 NOTE — Telephone Encounter (Signed)
Sent to [facility] pt notified

## 2013-07-10 NOTE — Telephone Encounter (Signed)
Change to dyazide 37.5/25 one each morn 30 three ref

## 2013-07-10 NOTE — Telephone Encounter (Signed)
Brett Canales to address (her PCP)

## 2013-07-10 NOTE — Telephone Encounter (Signed)
Sent to [facility] pt notified

## 2013-07-30 LAB — HEPATIC FUNCTION PANEL
ALT: 17 U/L (ref 0–35)
AST: 17 U/L (ref 0–37)
Albumin: 4.6 g/dL (ref 3.5–5.2)
Alkaline Phosphatase: 97 U/L (ref 39–117)
Total Bilirubin: 0.5 mg/dL (ref 0.3–1.2)
Total Protein: 7.1 g/dL (ref 6.0–8.3)

## 2013-07-30 LAB — BASIC METABOLIC PANEL
Calcium: 9.6 mg/dL (ref 8.4–10.5)
Creat: 0.5 mg/dL (ref 0.50–1.10)
Sodium: 140 mEq/L (ref 135–145)

## 2013-07-30 LAB — TSH: TSH: 3.092 u[IU]/mL (ref 0.350–4.500)

## 2013-07-30 LAB — LIPID PANEL
Cholesterol: 200 mg/dL (ref 0–200)
HDL: 61 mg/dL (ref >39)
Total CHOL/HDL Ratio: 3.3 Ratio
Triglycerides: 103 mg/dL (ref <150)

## 2013-08-07 ENCOUNTER — Ambulatory Visit (INDEPENDENT_AMBULATORY_CARE_PROVIDER_SITE_OTHER): Payer: BC Managed Care – PPO | Admitting: Family Medicine

## 2013-08-07 ENCOUNTER — Encounter: Payer: Self-pay | Admitting: Family Medicine

## 2013-08-07 VITALS — BP 146/88 | Ht 63.0 in | Wt 255.3 lb

## 2013-08-07 DIAGNOSIS — F329 Major depressive disorder, single episode, unspecified: Secondary | ICD-10-CM

## 2013-08-07 DIAGNOSIS — F32A Depression, unspecified: Secondary | ICD-10-CM | POA: Insufficient documentation

## 2013-08-07 MED ORDER — FLUOXETINE HCL 20 MG PO CAPS: 20.0000 mg | ORAL_CAPSULE | Freq: Every day | ORAL | Status: DC

## 2013-08-07 NOTE — Progress Notes (Signed)
  Subjective:    Patient ID: [name], female    DOB: 1956/08/16, 57 y.o.   MRN: [id number]  HPI Patient arrives at office to discuss recent blood work results. Reports a recent fall that took 2 weeks to get over.  Fatigue is ongoing. Feels exhausted a lot. Thinks that she gets enough sleep.  Took a fall in house. Tripped over a tengt in the hallway. Took a bad fall, took a bad fall. Some pain at the site of the old knee injury  Patient admits to feeling down at times. Somewhat depressed. No suicidal thoughts. Positive history of on antidepressants in past.  Patient claims compliance with blood pressure medicine. Trying to cut down salt in diet. Not exercising much.  Results for orders placed in visit on 07/07/13  LIPID PANEL      Result Value Range   Cholesterol 200  0 - 200 mg/dL   Triglycerides 782  <956 mg/dL   HDL 61  >21 mg/dL   Total CHOL/HDL Ratio 3.3     VLDL 21  0 - 40 mg/dL   LDL Cholesterol 308 (*) 0 - 99 mg/dL  HEPATIC FUNCTION PANEL      Result Value Range   Total Bilirubin 0.5  0.3 - 1.2 mg/dL   Bilirubin, Direct 0.1  0.0 - 0.3 mg/dL   Indirect Bilirubin 0.4  0.0 - 0.9 mg/dL   Alkaline Phosphatase 97  39 - 117 U/L   AST 17  0 - 37 U/L   ALT 17  0 - 35 U/L   Total Protein 7.1  6.0 - 8.3 g/dL   Albumin 4.6  3.5 - 5.2 g/dL  BASIC METABOLIC PANEL      Result Value Range   Sodium 140  135 - 145 mEq/L   Potassium 4.4  3.5 - 5.3 mEq/L   Chloride 102  96 - 112 mEq/L   CO2 29  19 - 32 mEq/L   Glucose, Bld 90  70 - 99 mg/dL   BUN 14  6 - 23 mg/dL   Creat 6.57  8.46 - 9.62 mg/dL   Calcium 9.6  8.4 - 95.2 mg/dL  TSH      Result Value Range   TSH 3.092  0.350 - 4.500 uIU/mL     Review of Systems No chest pain minimal headache no shortness of breath positive fatigue no abdominal pain no change in urinary or bowel habits ROS otherwise negative    Objective:   Physical Exam  Alert vitals stable. Lungs clear. Heart rare rhythm. H&T mom his congestion. Right  knee significant crepitations ankles trace edema      Assessment & Plan:  Impression 1 hypertension improved though not ideal. #2 knee contusion with arthritis discuss. #3 fatigue. #4 depression. #5 ADD patient states she also has this is a diagnosis in the past. Plan initiate Prozac. Diet exercise discussed. Maintain other meds. Patient has been ADD intervention would like to see a she response to antidepressants versus. Recheck in 3 months. WSL

## 2013-08-07 NOTE — Patient Instructions (Signed)
Try to exercise regularly

## 2013-11-10 ENCOUNTER — Ambulatory Visit: Payer: BC Managed Care – PPO | Admitting: Family Medicine

## 2014-06-03 ENCOUNTER — Other Ambulatory Visit: Payer: Self-pay | Admitting: Family Medicine

## 2014-06-03 DIAGNOSIS — Z1231 Encounter for screening mammogram for malignant neoplasm of breast: Secondary | ICD-10-CM

## 2014-06-10 ENCOUNTER — Ambulatory Visit: Payer: Self-pay

## 2014-06-25 ENCOUNTER — Ambulatory Visit: Payer: Self-pay

## 2015-11-10 ENCOUNTER — Encounter (HOSPITAL_COMMUNITY): Payer: Self-pay | Admitting: *Deleted

## 2015-11-10 ENCOUNTER — Emergency Department (HOSPITAL_COMMUNITY): Payer: Managed Care, Other (non HMO)

## 2015-11-10 ENCOUNTER — Inpatient Hospital Stay (HOSPITAL_COMMUNITY)
Admission: EM | Admit: 2015-11-10 | Discharge: 2015-11-13 | DRG: 445 | Disposition: A | Discharge status: Home / Self Care | Payer: Managed Care, Other (non HMO) | Attending: Internal Medicine | Admitting: Internal Medicine

## 2015-11-10 DIAGNOSIS — Z87891 Personal history of nicotine dependence: Secondary | ICD-10-CM | POA: Diagnosis not present

## 2015-11-10 DIAGNOSIS — F329 Major depressive disorder, single episode, unspecified: Secondary | ICD-10-CM | POA: Diagnosis not present

## 2015-11-10 DIAGNOSIS — R739 Hyperglycemia, unspecified: Secondary | ICD-10-CM | POA: Diagnosis not present

## 2015-11-10 DIAGNOSIS — R112 Nausea with vomiting, unspecified: Secondary | ICD-10-CM

## 2015-11-10 DIAGNOSIS — K810 Acute cholecystitis: Secondary | ICD-10-CM | POA: Diagnosis present

## 2015-11-10 DIAGNOSIS — K8000 Calculus of gallbladder with acute cholecystitis without obstruction: Principal | ICD-10-CM | POA: Diagnosis present

## 2015-11-10 DIAGNOSIS — J45909 Unspecified asthma, uncomplicated: Secondary | ICD-10-CM | POA: Diagnosis present

## 2015-11-10 DIAGNOSIS — R7989 Other specified abnormal findings of blood chemistry: Secondary | ICD-10-CM | POA: Insufficient documentation

## 2015-11-10 DIAGNOSIS — Z6841 Body Mass Index (BMI) 40.0 and over, adult: Secondary | ICD-10-CM

## 2015-11-10 DIAGNOSIS — Z833 Family history of diabetes mellitus: Secondary | ICD-10-CM

## 2015-11-10 DIAGNOSIS — R109 Unspecified abdominal pain: Secondary | ICD-10-CM | POA: Diagnosis not present

## 2015-11-10 DIAGNOSIS — I10 Essential (primary) hypertension: Secondary | ICD-10-CM | POA: Diagnosis present

## 2015-11-10 DIAGNOSIS — M545 Low back pain, unspecified: Secondary | ICD-10-CM

## 2015-11-10 DIAGNOSIS — E669 Obesity, unspecified: Secondary | ICD-10-CM | POA: Diagnosis present

## 2015-11-10 DIAGNOSIS — K760 Fatty (change of) liver, not elsewhere classified: Secondary | ICD-10-CM | POA: Diagnosis present

## 2015-11-10 DIAGNOSIS — R748 Abnormal levels of other serum enzymes: Secondary | ICD-10-CM | POA: Diagnosis present

## 2015-11-10 DIAGNOSIS — R945 Abnormal results of liver function studies: Secondary | ICD-10-CM

## 2015-11-10 DIAGNOSIS — F32A Depression, unspecified: Secondary | ICD-10-CM | POA: Diagnosis present

## 2015-11-10 DIAGNOSIS — F419 Anxiety disorder, unspecified: Secondary | ICD-10-CM | POA: Diagnosis present

## 2015-11-10 DIAGNOSIS — R1110 Vomiting, unspecified: Secondary | ICD-10-CM | POA: Insufficient documentation

## 2015-11-10 DIAGNOSIS — R1013 Epigastric pain: Secondary | ICD-10-CM | POA: Diagnosis present

## 2015-11-10 LAB — URINALYSIS, ROUTINE W REFLEX MICROSCOPIC
BILIRUBIN URINE: NEGATIVE
GLUCOSE, UA: NEGATIVE mg/dL
Hgb urine dipstick: NEGATIVE
Leukocytes, UA: NEGATIVE
Nitrite: NEGATIVE
PH: 8.5 — AB (ref 5.0–8.0)
Protein, ur: NEGATIVE mg/dL
SPECIFIC GRAVITY, URINE: 1.01 (ref 1.005–1.030)

## 2015-11-10 LAB — CBC WITH DIFFERENTIAL/PLATELET
BASOS ABS: 0 10*3/uL (ref 0.0–0.1)
BASOS PCT: 0 %
EOS PCT: 0 %
Eosinophils Absolute: 0 10*3/uL (ref 0.0–0.7)
HCT: 42.3 % (ref 36.0–46.0)
Hemoglobin: 13.8 g/dL (ref 12.0–15.0)
LYMPHS PCT: 9 %
Lymphs Abs: 0.8 10*3/uL (ref 0.7–4.0)
MCH: 30.2 pg (ref 26.0–34.0)
MCHC: 32.6 g/dL (ref 30.0–36.0)
MCV: 92.6 fL (ref 78.0–100.0)
MONO ABS: 0.6 10*3/uL (ref 0.1–1.0)
Monocytes Relative: 7 %
Neutro Abs: 7.3 10*3/uL (ref 1.7–7.7)
Neutrophils Relative %: 84 %
PLATELETS: 246 10*3/uL (ref 150–400)
RBC: 4.57 MIL/uL (ref 3.87–5.11)
RDW: 13.5 % (ref 11.5–15.5)
WBC: 8.7 10*3/uL (ref 4.0–10.5)

## 2015-11-10 LAB — COMPREHENSIVE METABOLIC PANEL
ALBUMIN: 4.2 g/dL (ref 3.5–5.0)
ALK PHOS: 236 U/L — AB (ref 38–126)
ALT: 1039 U/L — ABNORMAL HIGH (ref 14–54)
ANION GAP: 12 (ref 5–15)
AST: 1385 U/L — AB (ref 15–41)
BILIRUBIN TOTAL: 2.5 mg/dL — AB (ref 0.3–1.2)
BUN: 16 mg/dL (ref 6–20)
CALCIUM: 8.8 mg/dL — AB (ref 8.9–10.3)
CO2: 26 mmol/L (ref 22–32)
Chloride: 99 mmol/L — ABNORMAL LOW (ref 101–111)
Creatinine, Ser: 0.48 mg/dL (ref 0.44–1.00)
GFR calc Af Amer: >60 mL/min (ref >60)
GLUCOSE: 147 mg/dL — AB (ref 65–99)
POTASSIUM: 3.8 mmol/L (ref 3.5–5.1)
Sodium: 137 mmol/L (ref 135–145)
TOTAL PROTEIN: 7.9 g/dL (ref 6.5–8.1)

## 2015-11-10 LAB — LIPASE, BLOOD: LIPASE: 29 U/L (ref 11–51)

## 2015-11-10 LAB — BILIRUBIN, DIRECT: BILIRUBIN DIRECT: 1 mg/dL — AB (ref 0.1–0.5)

## 2015-11-10 MED ORDER — MORPHINE SULFATE (PF) 2 MG/ML IV SOLN
1.0000 mg | INTRAVENOUS | Status: DC | PRN
  Administered 2015-11-10 – 2015-11-12 (×4): 1 mg via INTRAVENOUS
  Filled 2015-11-10 (×5): qty 1

## 2015-11-10 MED ORDER — ALBUTEROL SULFATE (2.5 MG/3ML) 0.083% IN NEBU: 2.5000 mg | INHALATION_SOLUTION | RESPIRATORY_TRACT | Status: DC | PRN

## 2015-11-10 MED ORDER — ESCITALOPRAM OXALATE 10 MG PO TABS
10.0000 mg | ORAL_TABLET | Freq: Every day | ORAL | Status: DC
  Administered 2015-11-10 – 2015-11-13 (×4): 10 mg via ORAL
  Filled 2015-11-10 (×4): qty 1

## 2015-11-10 MED ORDER — ONDANSETRON HCL 4 MG PO TABS: 4.0000 mg | ORAL_TABLET | Freq: Four times a day (QID) | ORAL | Status: DC | PRN

## 2015-11-10 MED ORDER — OXYCODONE HCL 5 MG PO TABS
5.0000 mg | ORAL_TABLET | ORAL | Status: DC | PRN
  Administered 2015-11-10 – 2015-11-11 (×2): 5 mg via ORAL
  Filled 2015-11-10 (×2): qty 1

## 2015-11-10 MED ORDER — SODIUM CHLORIDE 0.9 % IV SOLN
INTRAVENOUS | Status: DC
  Administered 2015-11-10: 16:00:00 via INTRAVENOUS

## 2015-11-10 MED ORDER — ONDANSETRON HCL 4 MG/2ML IJ SOLN
4.0000 mg | Freq: Four times a day (QID) | INTRAMUSCULAR | Status: DC | PRN
  Administered 2015-11-11 – 2015-11-12 (×3): 4 mg via INTRAVENOUS
  Filled 2015-11-10 (×3): qty 2

## 2015-11-10 MED ORDER — HEPARIN SODIUM (PORCINE) 5000 UNIT/ML IJ SOLN
5000.0000 [IU] | Freq: Three times a day (TID) | INTRAMUSCULAR | Status: DC
  Administered 2015-11-10 – 2015-11-12 (×4): 5000 [IU] via SUBCUTANEOUS
  Filled 2015-11-10 (×6): qty 1

## 2015-11-10 MED ORDER — FENTANYL CITRATE (PF) 100 MCG/2ML IJ SOLN
50.0000 ug | Freq: Once | INTRAMUSCULAR | Status: AC
  Administered 2015-11-10: 50 ug via INTRAVENOUS
  Filled 2015-11-10: qty 2

## 2015-11-10 MED ORDER — FENTANYL CITRATE (PF) 100 MCG/2ML IJ SOLN
25.0000 ug | Freq: Once | INTRAMUSCULAR | Status: AC
  Administered 2015-11-10: 25 ug via INTRAVENOUS
  Filled 2015-11-10: qty 2

## 2015-11-10 MED ORDER — SODIUM CHLORIDE 0.9 % IV BOLUS (SEPSIS)
1000.0000 mL | Freq: Once | INTRAVENOUS | Status: AC
  Administered 2015-11-10: 1000 mL via INTRAVENOUS

## 2015-11-10 MED ORDER — KETOROLAC TROMETHAMINE 30 MG/ML IJ SOLN
30.0000 mg | Freq: Once | INTRAMUSCULAR | Status: AC
  Administered 2015-11-10: 30 mg via INTRAVENOUS
  Filled 2015-11-10: qty 1

## 2015-11-10 MED ORDER — ONDANSETRON HCL 4 MG/2ML IJ SOLN
INTRAMUSCULAR | Status: AC
  Administered 2015-11-10: 4 mg
  Filled 2015-11-10: qty 2

## 2015-11-10 MED ORDER — CIPROFLOXACIN IN D5W 400 MG/200ML IV SOLN
400.0000 mg | Freq: Two times a day (BID) | INTRAVENOUS | Status: DC
  Administered 2015-11-10 – 2015-11-11 (×4): 400 mg via INTRAVENOUS
  Filled 2015-11-10 (×5): qty 200

## 2015-11-10 NOTE — ED Notes (Signed)
Attempted report 

## 2015-11-10 NOTE — ED Provider Notes (Signed)
CSN: [id number]     Arrival date & time 11/10/15  0330 History   First MD Initiated Contact with Patient 11/10/15 0350     Chief Complaint  Patient presents with  . Back Pain     (Consider location/radiation/quality/duration/timing/severity/associated sxs/prior Treatment) HPI patient states this is the third episode of abdominal and back pain she has had in the past 8 days. She reports she had episode lasting about 1 hour 8 days ago, it came again on Thanksgiving day which was 6 days ago and lasted about 2 hours, and then tonight it started about 8:30 PM and has not let up. She states she is having diffuse abdominal cramping and is having pain in her lower back diffusely. She states it feels like back labor except it doesn't let up. Tonight is the first time she has had vomiting with the pain. She also states the pain is so intense it makes her feel short of breath. She denies diarrhea. She states she has been taking Pepto-Bismol since Thanksgiving for this discomfort. She states tonight she ate grilled chicken and coleslaw and tea from [employer]. She has not noted however any relation to food in the pain. She states couple days ago after urinating when she wiped she saw some blood on the toilet paper. She denies having any prior abdominal surgeries. Prior to last week she's never had this pain before.  She states she ran out of her blood pressure medicine 2-3 months ago.   PCP Dr Lodema Pilot  Past Medical History  Diagnosis Date  . Hypertension   . Asthma   . Depression    Past Surgical History  Procedure Laterality Date  . Replacement total knee Left 2008   Family History  Problem Relation Age of Onset  . Diabetes Maternal Aunt   . Diabetes Other   . Heart disease Other    Social History  Substance Use Topics  . Smoking status: Former Games developer  . Smokeless tobacco: Former Neurosurgeon    Quit date: 10/11/2008  . Alcohol Use: No   Unemployed Lives with spouse  OB History      No data available     Review of Systems  All other systems reviewed and are negative.     Allergies  Penicillins  Home Medications   Prior to Admission medications   Medication Sig Start Date End Date Taking? Authorizing Provider  escitalopram (LEXAPRO) 10 MG tablet Take 10 mg by mouth daily.   Yes Historical Provider, MD    BP 168/75 mmHg  Pulse 91  Temp(Src) 98 F (36.7 C) (Oral)  Resp 23  Ht  (1.6 m)  Wt 250 lb (113.399 kg)  BMI 44.30 kg/m2  SpO2 99%  Vital signs normal except for hypertension  Physical Exam  Constitutional: She is oriented to person, place, and time. She appears well-developed and well-nourished.  Non-toxic appearance. She does not appear ill. No distress.  HENT:  Head: Normocephalic and atraumatic.  Right Ear: External ear normal.  Left Ear: External ear normal.  Nose: Nose normal. No mucosal edema or rhinorrhea.  Mouth/Throat: Oropharynx is clear and moist and mucous membranes are normal. No dental abscesses or uvula swelling.  Eyes: Conjunctivae and EOM are normal. Pupils are equal, round, and reactive to light.  Neck: Normal range of motion and full passive range of motion without pain. Neck supple.  Cardiovascular: Normal rate, regular rhythm and normal heart sounds.  Exam reveals no gallop and no friction rub.  No murmur heard. Pulmonary/Chest: Effort normal and breath sounds normal. No respiratory distress. She has no wheezes. She has no rhonchi. She has no rales. She exhibits no tenderness and no crepitus.  Abdominal: Soft. Normal appearance and bowel sounds are normal. She exhibits no distension, no abdominal bruit and no pulsatile midline mass. There is generalized tenderness. There is no rebound and no guarding.  Musculoskeletal: Normal range of motion. She exhibits no edema or tenderness.       Back:  Moves all extremities well. Area of pain noted  Neurological: She is alert and oriented to person, place, and time. She has  normal strength. No cranial nerve deficit.  Skin: Skin is warm, dry and intact. No rash noted. No erythema. There is pallor.  Psychiatric: She has a normal mood and affect. Her speech is normal and behavior is normal. Her mood appears not anxious.  Nursing note and vitals reviewed.   ED Course  Procedures (including critical care time)  Medications  ketorolac (TORADOL) 30 MG/ML injection 30 mg (not administered)  ondansetron (ZOFRAN) 4 MG/2ML injection (4 mg  Given 11/10/15 0409)  fentaNYL (SUBLIMAZE) injection 25 mcg (25 mcg Intravenous Given 11/10/15 0413)  sodium chloride 0.9 % bolus 1,000 mL (1,000 mLs Intravenous New Bag/Given 11/10/15 0415)   Patient was given Zofran and she states she feels much improved.  Patient was rechecked at 6:40 AM. She states her pain is much improved. She states now she is getting some slight twinges in her left back. We discussed her CT results and her laboratory results specifically her very elevated LFTs which were normal 2 years ago. Patient is agreeable to wait to get a ultrasound of her gallbladder done this morning.  Pt left at change of shift at 07:45 with Dr Adriana Simasook to get the results of her US.    Labs Review Results for orders placed or performed during the hospital encounter of 11/10/15  Urinalysis, Routine w reflex microscopic (not [facility])  Result Value Ref Range   Color, Urine YELLOW YELLOW   APPearance CLEAR CLEAR   Specific Gravity, Urine 1.010 1.005 - 1.030   pH 8.5 (H) 5.0 - 8.0   Glucose, UA NEGATIVE NEGATIVE mg/dL   Hgb urine dipstick NEGATIVE NEGATIVE   Bilirubin Urine NEGATIVE NEGATIVE   Ketones, ur TRACE (A) NEGATIVE mg/dL   Protein, ur NEGATIVE NEGATIVE mg/dL   Nitrite NEGATIVE NEGATIVE   Leukocytes, UA NEGATIVE NEGATIVE  Comprehensive metabolic panel  Result Value Ref Range   Sodium 137 135 - 145 mmol/L   Potassium 3.8 3.5 - 5.1 mmol/L   Chloride 99 (L) 101 - 111 mmol/L   CO2 26 22 - 32 mmol/L   Glucose, Bld 147 (H) 65  - 99 mg/dL   BUN 16 6 - 20 mg/dL   Creatinine, Ser 1.610.48 0.44 - 1.00 mg/dL   Calcium 8.8 (L) 8.9 - 10.3 mg/dL   Total Protein 7.9 6.5 - 8.1 g/dL   Albumin 4.2 3.5 - 5.0 g/dL   AST [id number] (H) 15 - 41 U/L   ALT 1039 (H) 14 - 54 U/L   Alkaline Phosphatase 236 (H) 38 - 126 U/L   Total Bilirubin 2.5 (H) 0.3 - 1.2 mg/dL   GFR calc non Af Amer >60 >60 mL/min   GFR calc Af Amer >60 >60 mL/min   Anion gap 12 5 - 15  CBC with Differential  Result Value Ref Range   WBC 8.7 4.0 - 10.5 K/uL   RBC 4.57  3.87 - 5.11 MIL/uL   Hemoglobin 13.8 12.0 - 15.0 g/dL   HCT 16.1 09.6 - 04.5 %   MCV 92.6 78.0 - 100.0 fL   MCH 30.2 26.0 - 34.0 pg   MCHC 32.6 30.0 - 36.0 g/dL   RDW 40.9 81.1 - 91.4 %   Platelets 246 150 - 400 K/uL   Neutrophils Relative % 84 %   Neutro Abs 7.3 1.7 - 7.7 K/uL   Lymphocytes Relative 9 %   Lymphs Abs 0.8 0.7 - 4.0 K/uL   Monocytes Relative 7 %   Monocytes Absolute 0.6 0.1 - 1.0 K/uL   Eosinophils Relative 0 %   Eosinophils Absolute 0.0 0.0 - 0.7 K/uL   Basophils Relative 0 %   Basophils Absolute 0.0 0.0 - 0.1 K/uL  Lipase, blood  Result Value Ref Range   Lipase 29 11 - 51 U/L   Laboratory interpretation all normal except elevated LFTs     Imaging Review Ct Abdomen Pelvis Wo Contrast  11/10/2015  CLINICAL DATA:  Sharp lower back pain EXAM: CT ABDOMEN AND PELVIS WITHOUT CONTRAST TECHNIQUE: Multidetector CT imaging of the abdomen and pelvis was performed following the standard protocol without IV contrast. COMPARISON:  None. FINDINGS: There is mild periureteral stranding on the right, possibly with a 3 mm calculus within the urinary bladder lumen. No conclusive obstructing ureteral calculus is evident at this time. No collecting system calculi. There are unremarkable unenhanced appearances of the liver, gallbladder, pancreas, spleen, adrenals and left kidney. Bowel is unremarkable. Uterus and ovaries are unremarkable. No focal inflammatory changes are evident in the abdomen  or pelvis. The abdominal aorta is normal in caliber with mild atherosclerotic calcification. There is no significant abnormality in the lower chest. There is no significant musculoskeletal lesion. Moderate facet arthropathy is present bilaterally at L5-S1. IMPRESSION: Mild periureteral stranding on the right, perhaps representing recent stone passage. There may be a 3 mm calculus in the urinary bladder lumen but no conclusive obstructing ureteral calculus is present this time. No other significant abnormality is evident. Electronically Signed   By: Ellery Plunk M.D.   On: 11/10/2015 05:39   I have personally reviewed and evaluated these images and lab results as part of my medical decision-making.   EKG Interpretation None      MDM   Final diagnoses:  Abdominal pain, unspecified abdominal location  Bilateral low back pain without sciatica  Elevated liver function tests  Nausea and vomiting, vomiting of unspecified type    Disposition pending  Devoria Albe, MD, Concha Pyo, MD 11/10/15 [phone]

## 2015-11-10 NOTE — ED Notes (Signed)
Pt taken to US

## 2015-11-10 NOTE — ED Notes (Signed)
Pt c/o severe lower back pain that radiates around to the front of her abdomen; pt states the pain is causing her to feel sob

## 2015-11-10 NOTE — Progress Notes (Signed)
Patient noted to have box of Sudafed PE on bedside table during report this evening. Pt stated it was hers and she had taken a dose this evening. Discussed with her that she should not take any medications from home while in the hospital. Sudafed PE was given to her daughter at bedside to take home per patient request. She requested "sinus medication" for while she is in the hospital. Text-paged Dr. Karilyn CotaGosrani about 1945 to notify she had taken a dose and that her AST/ALT levels are elevated since it contains acetaminophen. Also, text-paged Donnamarie PoagK. Kirby, mid-level at 2010 to be sure a provider is aware. Earnstine RegalAshley Raney Antwine, RN

## 2015-11-10 NOTE — ED Notes (Addendum)
,   explained to pt that we are holding for beds upstairs. Pt verbalized understanding.

## 2015-11-10 NOTE — ED Provider Notes (Signed)
Gallbladder ultrasound reveals gallstones with a possible gallbladder wall thickening. Discussed with general surgery. Admit to general medicine. This was all discussed with the patient and her husband.  Donnetta HutchingBrian Shantel Wesely, MD 11/10/15 1055

## 2015-11-10 NOTE — H&P (Signed)
[facility] History and Physical  [name] FVC:[id number] DOB: 07/12/1956 DOA: 11/10/2015   PCP: Leamon Arnt, MD  Specialists: None  Chief Complaint: Abdominal pain, radiating to the back  HPI: [name] is a 59 y.o. female with a past medical history of depression and anxiety, hypertension, obesity, who was in her usual state of health till about 2-3 weeks ago when she started experiencing burning-like sensation in the upper part of her abdomen. She would have nausea with few dry heaves. The symptoms progressively got worse and today they got up to 10 out of 10 in intensity. The pain started radiating to the back. She felt a bandlike sensation across the upper part of her abdomen towards the back. She denies any fever or chills. She's never had similar symptoms before. She subsequently decided to come into the hospital for further evaluation.  In the emergency department she was found to have significantly elevated liver function tests. CT scan of the abdomen showed questionable right perinephric stranding consistent with passage of a kidney stone. Patient also underwent an ultrasound which showed cholelithiasis with thickened gallbladder wall, raising concern for cholecystitis.  Home Medications: Prior to Admission medications   Medication Sig Start Date End Date Taking? Authorizing Provider  acetaminophen (TYLENOL) 325 MG tablet Take 325 mg by mouth every 6 (six) hours as needed for mild pain.   Yes Historical Provider, MD  escitalopram (LEXAPRO) 10 MG tablet Take 10 mg by mouth daily.   Yes Historical Provider, MD  ibuprofen (ADVIL,MOTRIN) 200 MG tablet Take 200 mg by mouth every 6 (six) hours as needed for pain.   Yes Historical Provider, MD  triamcinolone (NASACORT) 55 MCG/ACT nasal inhaler Place 2 sprays into the nose daily.   Yes Historical Provider, MD  FLUoxetine (PROZAC) 20 MG capsule Take 1 capsule (20 mg total) by mouth daily. 08/07/13 08/07/14  Mikey Kirschner, MD    Allergies:  Allergies  Allergen Reactions  . Penicillins Anaphylaxis    Past Medical History: Past Medical History  Diagnosis Date  . Hypertension   . Asthma   . Depression     Past Surgical History  Procedure Laterality Date  . Replacement total knee Left 2008    Social History: Patient lives [place] with her husband. Denies smoking currently. She quit about 12 years ago. Denies any alcohol consumption. Denies any illicit drug use. Usually independent with daily activities.  Family History:  Family History  Problem Relation Age of Onset  . Diabetes Maternal Aunt   . Diabetes Other   . Heart disease Other      Review of Systems - History obtained from the patient General ROS: positive for  - fatigue Psychological ROS: negative Ophthalmic ROS: negative ENT ROS: negative Allergy and Immunology ROS: negative Hematological and Lymphatic ROS: negative Endocrine ROS: negative Respiratory ROS: no cough, shortness of breath, or wheezing Cardiovascular ROS: no chest pain or dyspnea on exertion Gastrointestinal ROS: as in hpi Genito-Urinary ROS: no dysuria, trouble voiding, or hematuria Musculoskeletal ROS: negative Neurological ROS: no TIA or stroke symptoms Dermatological ROS: negative  Physical Examination  Filed Vitals:   11/10/15 1115 11/10/15 1130 11/10/15 1200 11/10/15 1300  BP: 140/81 141/65 128/63 129/74  Pulse: 72 69 74 84  Temp:      TempSrc:      Resp:      Height:      Weight:      SpO2: 99% 98% 94% 94%    BP 129/74  mmHg  Pulse 84  Temp(Src) 98 F (36.7 C) (Oral)  Resp 18  Ht 5' 3"  (1.6 m)  Wt 113.399 kg (250 lb)  BMI 44.30 kg/m2  SpO2 94%  General appearance: alert, cooperative, appears stated age and no distress Head: Normocephalic, without obvious abnormality, atraumatic Eyes: conjunctivae/corneas clear. PERRL, EOM's intact.  Throat: lips, mucosa, and tongue normal; teeth and gums normal Neck: no adenopathy, no carotid  bruit, no JVD, supple, symmetrical, trachea midline and thyroid not enlarged, symmetric, no tenderness/mass/nodules Resp: clear to auscultation bilaterally Cardio: regular rate and rhythm, S1, S2 normal, no murmur, click, rub or gallop GI: Obese. Soft. Tender in the right upper quadrant with equivocal Murphy's sign. No rebound, rigidity or guarding. No masses or organomegaly. Bowel sounds are present. Extremities: extremities normal, atraumatic, no cyanosis or edema Pulses: 2+ and symmetric Skin: Skin color, texture, turgor normal. No rashes or lesions Lymph nodes: Cervical, supraclavicular, and axillary nodes normal. Neurologic: no focal deficits.  Laboratory Data: Results for orders placed or performed during the hospital encounter of 11/10/15 (from the past 48 hour(s))  Urinalysis, Routine w reflex microscopic (not at [facility])     Status: Abnormal   Collection Time: 11/10/15  3:42 AM  Result Value Ref Range   Color, Urine YELLOW YELLOW   APPearance CLEAR CLEAR   Specific Gravity, Urine 1.010 1.005 - 1.030   pH 8.5 (H) 5.0 - 8.0   Glucose, UA NEGATIVE NEGATIVE mg/dL   Hgb urine dipstick NEGATIVE NEGATIVE   Bilirubin Urine NEGATIVE NEGATIVE   Ketones, ur TRACE (A) NEGATIVE mg/dL   Protein, ur NEGATIVE NEGATIVE mg/dL   Nitrite NEGATIVE NEGATIVE   Leukocytes, UA NEGATIVE NEGATIVE    Comment: MICROSCOPIC NOT DONE ON URINES WITH NEGATIVE PROTEIN, BLOOD, LEUKOCYTES, NITRITE, OR GLUCOSE <1000 mg/dL.  Comprehensive metabolic panel     Status: Abnormal   Collection Time: 11/10/15  3:55 AM  Result Value Ref Range   Sodium 137 135 - 145 mmol/L   Potassium 3.8 3.5 - 5.1 mmol/L   Chloride 99 (L) 101 - 111 mmol/L   CO2 26 22 - 32 mmol/L   Glucose, Bld 147 (H) 65 - 99 mg/dL   BUN 16 6 - 20 mg/dL   Creatinine, Ser 0.48 0.44 - 1.00 mg/dL   Calcium 8.8 (L) 8.9 - 10.3 mg/dL   Total Protein 7.9 6.5 - 8.1 g/dL   Albumin 4.2 3.5 - 5.0 g/dL   AST 1385 (H) 15 - 41 U/L   ALT 1039 (H) 14 - 54 U/L    Alkaline Phosphatase 236 (H) 38 - 126 U/L   Total Bilirubin 2.5 (H) 0.3 - 1.2 mg/dL   GFR calc non Af Amer >60 >60 mL/min   GFR calc Af Amer >60 >60 mL/min    Comment: (NOTE) The eGFR has been calculated using the CKD EPI equation. This calculation has not been validated in all clinical situations. eGFR's persistently <60 mL/min signify possible Chronic Kidney Disease.    Anion gap 12 5 - 15  CBC with Differential     Status: None   Collection Time: 11/10/15  3:55 AM  Result Value Ref Range   WBC 8.7 4.0 - 10.5 K/uL   RBC 4.57 3.87 - 5.11 MIL/uL   Hemoglobin 13.8 12.0 - 15.0 g/dL   HCT 42.3 36.0 - 46.0 %   MCV 92.6 78.0 - 100.0 fL   MCH 30.2 26.0 - 34.0 pg   MCHC 32.6 30.0 - 36.0 g/dL   RDW 13.5  11.5 - 15.5 %   Platelets 246 150 - 400 K/uL   Neutrophils Relative % 84 %   Neutro Abs 7.3 1.7 - 7.7 K/uL   Lymphocytes Relative 9 %   Lymphs Abs 0.8 0.7 - 4.0 K/uL   Monocytes Relative 7 %   Monocytes Absolute 0.6 0.1 - 1.0 K/uL   Eosinophils Relative 0 %   Eosinophils Absolute 0.0 0.0 - 0.7 K/uL   Basophils Relative 0 %   Basophils Absolute 0.0 0.0 - 0.1 K/uL  Lipase, blood     Status: None   Collection Time: 11/10/15  3:55 AM  Result Value Ref Range   Lipase 29 11 - 51 U/L  Bilirubin, direct     Status: Abnormal   Collection Time: 11/10/15 11:20 AM  Result Value Ref Range   Bilirubin, Direct 1.0 (H) 0.1 - 0.5 mg/dL    Radiology Reports: Ct Abdomen Pelvis Wo Contrast  11/10/2015  CLINICAL DATA:  Sharp lower back pain EXAM: CT ABDOMEN AND PELVIS WITHOUT CONTRAST TECHNIQUE: Multidetector CT imaging of the abdomen and pelvis was performed following the standard protocol without IV contrast. COMPARISON:  None. FINDINGS: There is mild periureteral stranding on the right, possibly with a 3 mm calculus within the urinary bladder lumen. No conclusive obstructing ureteral calculus is evident at this time. No collecting system calculi. There are unremarkable unenhanced appearances of  the liver, gallbladder, pancreas, spleen, adrenals and left kidney. Bowel is unremarkable. Uterus and ovaries are unremarkable. No focal inflammatory changes are evident in the abdomen or pelvis. The abdominal aorta is normal in caliber with mild atherosclerotic calcification. There is no significant abnormality in the lower chest. There is no significant musculoskeletal lesion. Moderate facet arthropathy is present bilaterally at L5-S1. IMPRESSION: Mild periureteral stranding on the right, perhaps representing recent stone passage. There may be a 3 mm calculus in the urinary bladder lumen but no conclusive obstructing ureteral calculus is present this time. No other significant abnormality is evident. Electronically Signed   By: Andreas Newport M.D.   On: 11/10/2015 05:39   US Abdomen Limited Ruq  11/10/2015  CLINICAL DATA:  One day history of right upper quadrant pain EXAM: US ABDOMEN LIMITED - RIGHT UPPER QUADRANT COMPARISON:  CT abdomen and pelvis November 10, 2015 FINDINGS: Gallbladder: Within the gallbladder, there are multiple echogenic foci which move and shadow consistent with cholelithiasis. The largest gallstone measures 1.8 cm. The gallbladder wall is mildly thickened. No pericholecystic fluid is seen. No sonographic Murphy sign noted. Common bile duct: Diameter: 3 mm. There is no intrahepatic or extrahepatic biliary duct dilatation. Liver: No focal lesion identified. The liver echogenicity is overall increased. IMPRESSION: Gallstones with gallbladder wall thickening. These findings are concerning for early acute cholecystitis. Note that there is no pericholecystic fluid, and the patient is not focally tender over the gallbladder. Increased liver echogenicity diffusely, most likely due to hepatic steatosis. While no focal liver lesions are identified, it must be cautioned that the sensitivity of ultrasound for focal liver lesions is diminished in this circumstance. Electronically Signed   By:  Lowella Grip III M.D.   On: 11/10/2015 08:39    My interpretation of Electrocardiogram: EKG is pending  Problem List  Principal Problem:   Acute cholecystitis Active Problems:   OBESITY NOS   Essential hypertension   Depression   Elevated liver enzymes   Hyperglycemia   Assessment: This is a 59 year old Caucasian female with past medical history as stated earlier, who presents with upper abdominal  pain. She has a significantly elevated LFTs. Ultrasound is suspicious for acute cholecystitis. She has gallstones. CT scan raised concern regarding possible kidney stone as well.  Plan: #1 Upper abdominal pain with transaminitis and possible cholecystitis: Patient has been seen by general surgery. She'll be initiated on antibiotics. Hepatitis panel will be ordered. LFT's will be repeated tomorrow. She'll be kept on clear liquid diet. Surgery plans to observe for now and may consider cholecystectomy prior to discharge.  #2 Perinephric stranding observed on CT scan: No obstructing calculus noted. Continue to monitor clinically.  #3 history of essential hypertension: Patient states that she hasn't taken her blood pressure medicine in about 2 months. She doesn't know the name of this medicine, either. Blood pressure is reasonably well controlled. Continue to monitor.  #4 history of anxiety and depression: Continue Lexapro  #5 Hyperglycemia: Check fasting levels tomorrow. Also, check HbA1c. She reports family history of diabetes.   DVT Prophylaxis: Subcutaneous heparin Code Status: Full code Family Communication: Discussed with the patient and her husband  Disposition Plan: Admit to MedSurg   Further management decisions will depend on results of further testing and patient's response to treatment.   [facility]  [facility] Pager [phone]  If 7PM-7AM, please contact night-coverage [email or web address] Password [email or web address]  11/10/2015, 1:28 PM

## 2015-11-10 NOTE — Consult Note (Signed)
Reason for Consult: Cholelithiasis, abdominal pain Referring Physician: ER  [name] is an 59 y.o. female.  HPI: Patient is a 59 year old white female presented emergency room with worsening right upper quadrant abdominal pain as well as upper back pain. She was found to have significant elevation of her liver enzyme tests as well as an elevation in her total bilirubin. Ultrasound the gallbladder revealed cholelithiasis with a thickened gallbladder wall, but no pericholecystic fluid and her common bile duct was 3 mm in size. CT scan of the abdomen showed questionable right perinephric stranding consistent with passage of a kidney stone. Patient states she has had intermittent upper abdominal pain over the past few weeks. She denies any fever, chills, or jaundice.  Past Medical History  Diagnosis Date  . Hypertension   . Asthma   . Depression     Past Surgical History  Procedure Laterality Date  . Replacement total knee Left 2008    Family History  Problem Relation Age of Onset  . Diabetes Maternal Aunt   . Diabetes Other   . Heart disease Other     Social History:  reports that she has quit smoking. She quit smokeless tobacco use about 7 years ago. She reports that she does not drink alcohol or use illicit drugs.  Allergies:  Allergies  Allergen Reactions  . Penicillins Anaphylaxis    Medications: I have reviewed the patient's current medications.  Results for orders placed or performed during the hospital encounter of 11/10/15 (from the past 48 hour(s))  Urinalysis, Routine w reflex microscopic (not at [facility])     Status: Abnormal   Collection Time: 11/10/15  3:42 AM  Result Value Ref Range   Color, Urine YELLOW YELLOW   APPearance CLEAR CLEAR   Specific Gravity, Urine 1.010 1.005 - 1.030   pH 8.5 (H) 5.0 - 8.0   Glucose, UA NEGATIVE NEGATIVE mg/dL   Hgb urine dipstick NEGATIVE NEGATIVE   Bilirubin Urine NEGATIVE NEGATIVE   Ketones, ur TRACE (A) NEGATIVE mg/dL    Protein, ur NEGATIVE NEGATIVE mg/dL   Nitrite NEGATIVE NEGATIVE   Leukocytes, UA NEGATIVE NEGATIVE    Comment: MICROSCOPIC NOT DONE ON URINES WITH NEGATIVE PROTEIN, BLOOD, LEUKOCYTES, NITRITE, OR GLUCOSE <1000 mg/dL.  Comprehensive metabolic panel     Status: Abnormal   Collection Time: 11/10/15  3:55 AM  Result Value Ref Range   Sodium 137 135 - 145 mmol/L   Potassium 3.8 3.5 - 5.1 mmol/L   Chloride 99 (L) 101 - 111 mmol/L   CO2 26 22 - 32 mmol/L   Glucose, Bld 147 (H) 65 - 99 mg/dL   BUN 16 6 - 20 mg/dL   Creatinine, Ser 0.48 0.44 - 1.00 mg/dL   Calcium 8.8 (L) 8.9 - 10.3 mg/dL   Total Protein 7.9 6.5 - 8.1 g/dL   Albumin 4.2 3.5 - 5.0 g/dL   AST 1385 (H) 15 - 41 U/L   ALT 1039 (H) 14 - 54 U/L   Alkaline Phosphatase 236 (H) 38 - 126 U/L   Total Bilirubin 2.5 (H) 0.3 - 1.2 mg/dL   GFR calc non Af Amer >60 >60 mL/min   GFR calc Af Amer >60 >60 mL/min    Comment: (NOTE) The eGFR has been calculated using the CKD EPI equation. This calculation has not been validated in all clinical situations. eGFR's persistently <60 mL/min signify possible Chronic Kidney Disease.    Anion gap 12 5 - 15  CBC with Differential  Status: None   Collection Time: 11/10/15  3:55 AM  Result Value Ref Range   WBC 8.7 4.0 - 10.5 K/uL   RBC 4.57 3.87 - 5.11 MIL/uL   Hemoglobin 13.8 12.0 - 15.0 g/dL   HCT 42.3 36.0 - 46.0 %   MCV 92.6 78.0 - 100.0 fL   MCH 30.2 26.0 - 34.0 pg   MCHC 32.6 30.0 - 36.0 g/dL   RDW 13.5 11.5 - 15.5 %   Platelets 246 150 - 400 K/uL   Neutrophils Relative % 84 %   Neutro Abs 7.3 1.7 - 7.7 K/uL   Lymphocytes Relative 9 %   Lymphs Abs 0.8 0.7 - 4.0 K/uL   Monocytes Relative 7 %   Monocytes Absolute 0.6 0.1 - 1.0 K/uL   Eosinophils Relative 0 %   Eosinophils Absolute 0.0 0.0 - 0.7 K/uL   Basophils Relative 0 %   Basophils Absolute 0.0 0.0 - 0.1 K/uL  Lipase, blood     Status: None   Collection Time: 11/10/15  3:55 AM  Result Value Ref Range   Lipase 29 11 - 51 U/L     Ct Abdomen Pelvis Wo Contrast  11/10/2015  CLINICAL DATA:  Sharp lower back pain EXAM: CT ABDOMEN AND PELVIS WITHOUT CONTRAST TECHNIQUE: Multidetector CT imaging of the abdomen and pelvis was performed following the standard protocol without IV contrast. COMPARISON:  None. FINDINGS: There is mild periureteral stranding on the right, possibly with a 3 mm calculus within the urinary bladder lumen. No conclusive obstructing ureteral calculus is evident at this time. No collecting system calculi. There are unremarkable unenhanced appearances of the liver, gallbladder, pancreas, spleen, adrenals and left kidney. Bowel is unremarkable. Uterus and ovaries are unremarkable. No focal inflammatory changes are evident in the abdomen or pelvis. The abdominal aorta is normal in caliber with mild atherosclerotic calcification. There is no significant abnormality in the lower chest. There is no significant musculoskeletal lesion. Moderate facet arthropathy is present bilaterally at L5-S1. IMPRESSION: Mild periureteral stranding on the right, perhaps representing recent stone passage. There may be a 3 mm calculus in the urinary bladder lumen but no conclusive obstructing ureteral calculus is present this time. No other significant abnormality is evident. Electronically Signed   By: Andreas Newport M.D.   On: 11/10/2015 05:39   US Abdomen Limited Ruq  11/10/2015  CLINICAL DATA:  One day history of right upper quadrant pain EXAM: US ABDOMEN LIMITED - RIGHT UPPER QUADRANT COMPARISON:  CT abdomen and pelvis November 10, 2015 FINDINGS: Gallbladder: Within the gallbladder, there are multiple echogenic foci which move and shadow consistent with cholelithiasis. The largest gallstone measures 1.8 cm. The gallbladder wall is mildly thickened. No pericholecystic fluid is seen. No sonographic Murphy sign noted. Common bile duct: Diameter: 3 mm. There is no intrahepatic or extrahepatic biliary duct dilatation. Liver: No focal  lesion identified. The liver echogenicity is overall increased. IMPRESSION: Gallstones with gallbladder wall thickening. These findings are concerning for early acute cholecystitis. Note that there is no pericholecystic fluid, and the patient is not focally tender over the gallbladder. Increased liver echogenicity diffusely, most likely due to hepatic steatosis. While no focal liver lesions are identified, it must be cautioned that the sensitivity of ultrasound for focal liver lesions is diminished in this circumstance. Electronically Signed   By: Lowella Grip III M.D.   On: 11/10/2015 08:39    ROS: See chart Blood pressure 151/88, pulse 98, temperature 98 F (36.7 C), temperature source Oral, resp.  rate 18, height 5' 3"  (1.6 m), weight 113.399 kg (250 lb), SpO2 92 %. Physical Exam: Pleasant white female in no acute distress. Abdomen is soft with mild tenderness to palpation of the right upper quadrant. No hepatosplenomegaly, masses, or rigidity are noted, though examination is limited secondary to body habitus.  Assessment/Plan: Impression: Cholelithiasis, probable cholecystitis with elevations in liver enzyme tests. Will add direct bilirubin to previous lab draw. Plan: Would admit the patient for IV antibiotics and clear liquid diet. Will monitor liver enzyme tests. Patient may need hepatitis screen as her liver enzyme tests are higher than what she would associate with gallstone disease. May need cholecystectomy prior to discharge pending test results.  [name] A 11/10/2015, 10:53 AM

## 2015-11-11 DIAGNOSIS — R748 Abnormal levels of other serum enzymes: Secondary | ICD-10-CM

## 2015-11-11 DIAGNOSIS — K810 Acute cholecystitis: Secondary | ICD-10-CM

## 2015-11-11 DIAGNOSIS — R112 Nausea with vomiting, unspecified: Secondary | ICD-10-CM

## 2015-11-11 DIAGNOSIS — R109 Unspecified abdominal pain: Secondary | ICD-10-CM | POA: Insufficient documentation

## 2015-11-11 DIAGNOSIS — R945 Abnormal results of liver function studies: Secondary | ICD-10-CM

## 2015-11-11 DIAGNOSIS — R1110 Vomiting, unspecified: Secondary | ICD-10-CM | POA: Insufficient documentation

## 2015-11-11 DIAGNOSIS — E669 Obesity, unspecified: Secondary | ICD-10-CM

## 2015-11-11 DIAGNOSIS — R7989 Other specified abnormal findings of blood chemistry: Secondary | ICD-10-CM | POA: Insufficient documentation

## 2015-11-11 LAB — CBC
HEMATOCRIT: 36.7 % (ref 36.0–46.0)
HEMOGLOBIN: 11.8 g/dL — AB (ref 12.0–15.0)
MCH: 30.1 pg (ref 26.0–34.0)
MCHC: 32.2 g/dL (ref 30.0–36.0)
MCV: 93.6 fL (ref 78.0–100.0)
Platelets: 228 10*3/uL (ref 150–400)
RBC: 3.92 MIL/uL (ref 3.87–5.11)
RDW: 13.8 % (ref 11.5–15.5)
WBC: 7.1 10*3/uL (ref 4.0–10.5)

## 2015-11-11 LAB — COMPREHENSIVE METABOLIC PANEL
ALBUMIN: 3.4 g/dL — AB (ref 3.5–5.0)
ALT: 804 U/L — ABNORMAL HIGH (ref 14–54)
ANION GAP: 5 (ref 5–15)
AST: 402 U/L — AB (ref 15–41)
Alkaline Phosphatase: 206 U/L — ABNORMAL HIGH (ref 38–126)
BILIRUBIN TOTAL: 3.6 mg/dL — AB (ref 0.3–1.2)
BUN: 9 mg/dL (ref 6–20)
CHLORIDE: 108 mmol/L (ref 101–111)
CO2: 28 mmol/L (ref 22–32)
Calcium: 8.3 mg/dL — ABNORMAL LOW (ref 8.9–10.3)
Creatinine, Ser: 0.42 mg/dL — ABNORMAL LOW (ref 0.44–1.00)
GFR calc Af Amer: >60 mL/min (ref >60)
GFR calc non Af Amer: >60 mL/min (ref >60)
GLUCOSE: 122 mg/dL — AB (ref 65–99)
POTASSIUM: 4 mmol/L (ref 3.5–5.1)
SODIUM: 141 mmol/L (ref 135–145)
TOTAL PROTEIN: 6.8 g/dL (ref 6.5–8.1)

## 2015-11-11 LAB — HEMOGLOBIN A1C
Hgb A1c MFr Bld: 5.8 % — ABNORMAL HIGH (ref 4.8–5.6)
MEAN PLASMA GLUCOSE: 120 mg/dL

## 2015-11-11 LAB — PROTIME-INR
INR: 1.14 (ref 0.00–1.49)
Prothrombin Time: 14.8 seconds (ref 11.6–15.2)

## 2015-11-11 LAB — APTT: aPTT: 28 seconds (ref 24–37)

## 2015-11-11 LAB — BILIRUBIN, DIRECT: Bilirubin, Direct: 2 mg/dL — ABNORMAL HIGH (ref 0.1–0.5)

## 2015-11-11 LAB — TSH: TSH: 0.575 u[IU]/mL (ref 0.350–4.500)

## 2015-11-11 MED ORDER — FLUTICASONE PROPIONATE 50 MCG/ACT NA SUSP
1.0000 | Freq: Every day | NASAL | Status: DC
  Administered 2015-11-11 – 2015-11-13 (×3): 1 via NASAL
  Filled 2015-11-11: qty 16

## 2015-11-11 NOTE — Care Management Note (Signed)
Case Management Note  Patient Details  Name: Marlow BaarsJanice N Cupps MRN: [id number] Date of Birth: 08/10/1956  Subjective/Objective:                  Pt admitted with cholecystitis and elevated liver enzymes. Pt lives with her husband and will return home at discharge. Pt is independent with ADL's.  Action/Plan: No CM needs noted.  Expected Discharge Date:  11/12/15               Expected Discharge Plan:  Home/Self Care  In-House Referral:  NA  Discharge planning Services  CM Consult  Post Acute Care Choice:  NA Choice offered to:  NA  DME Arranged:    DME Agency:     HH Arranged:    HH Agency:     Status of Service:  Completed, signed off  Medicare Important Message Given:    Date Medicare IM Given:    Medicare IM give by:    Date Additional Medicare IM Given:    Additional Medicare Important Message give by:     If discussed at Long Length of Stay Meetings, dates discussed:    Additional Comments:  Cheryl FlashBlackwell, Elzia Hott Crowder, RN 11/11/2015, 3:16 PM

## 2015-11-11 NOTE — Consult Note (Signed)
Referring Provider: No ref. provider found Primary Care Physician:  Leamon Arnt, MD Primary Gastroenterologist:  Dr. Gala Romney  Date of Admission:  11/10/15 Date of Consultation:  11/11/15  Reason for Consultation:  Elevated LFTs  HPI:  59 year old female with a PMH of obesity, hypertension, depression, and anxiety presented to the ER with complaints of abdominal pain radiating to the back. Was found to have cholelithiasis and possible cholecystitis. CT abdomen/pelvis found mild periureteral stranding, possible urinary calculus in the bladder, otherwise no acute findings. Abdominal U/S found consistent with early acute cholecystitis, increased liver echogenicity likely due to hepatic steatosis and without focal liver lesion. Her LFTs on admission were found to be significantly elevated at AST/ALT of 1385/1039, Alk phos 236, bili 2.5. Her CBC was normal, as was PT/INR and lipase. Direct bili elevated at 1.0.  Today, her AST/ALT has improved drastically to 402/804, alk phos decreased to 206, bili with increase to 3.6 and direct bili to 2.0. Acute hepatitis panel is pending. Surgery has consulted and noted history of Hepatitis A and their further management is pending our consult.  Today she states she began having a "sour stomach" feeling about 3 months ago. A week ago she began having RUQ pain and back pain. This became significantly worse at Thanksgiving. Denies N/V, yellowing of skin or eyes, darkened urine. Does admit to a remote Hepatitis A infection when she was 59 years old when both she and her cousin became infected at a fish fry. Denies ETOH use, any new prescription medications, any OTC medications (other than recent NSAIDs for her pain), supplements, and herbal medications. She says she's feeling much better today, no further back pain, some occasional RUQ pain. She is sitting bedside dressed in clothes.  Past Medical History  Diagnosis Date  . Hypertension   . Asthma   . Depression      Past Surgical History  Procedure Laterality Date  . Replacement total knee Left 2008    Prior to Admission medications   Medication Sig Start Date End Date Taking? Authorizing Provider  acetaminophen (TYLENOL) 325 MG tablet Take 325 mg by mouth every 6 (six) hours as needed for mild pain.   Yes Historical Provider, MD  escitalopram (LEXAPRO) 10 MG tablet Take 10 mg by mouth daily.   Yes Historical Provider, MD  ibuprofen (ADVIL,MOTRIN) 200 MG tablet Take 200 mg by mouth every 6 (six) hours as needed for pain.   Yes Historical Provider, MD  triamcinolone (NASACORT) 55 MCG/ACT nasal inhaler Place 2 sprays into the nose daily.   Yes Historical Provider, MD  FLUoxetine (PROZAC) 20 MG capsule Take 1 capsule (20 mg total) by mouth daily. 08/07/13 08/07/14  Mikey Kirschner, MD    Current Facility-Administered Medications  Medication Dose Route Frequency Provider Last Rate Last Dose  . 0.9 %  sodium chloride infusion   Intravenous Continuous Bonnielee Haff, MD 75 mL/hr at 11/10/15 1617    . albuterol (PROVENTIL) (2.5 MG/3ML) 0.083% nebulizer solution 2.5 mg  2.5 mg Nebulization Q2H PRN Bonnielee Haff, MD      . ciprofloxacin (CIPRO) IVPB 400 mg  400 mg Intravenous Q12H Bonnielee Haff, MD   400 mg at 11/11/15 0323  . escitalopram (LEXAPRO) tablet 10 mg  10 mg Oral Daily Bonnielee Haff, MD   10 mg at 11/11/15 0813  . fluticasone (FLONASE) 50 MCG/ACT nasal spray 1 spray  1 spray Each Nare Daily Bonnielee Haff, MD   1 spray at 11/11/15 1220  . heparin  injection 5,000 Units  5,000 Units Subcutaneous 3 times per day Bonnielee Haff, MD   5,000 Units at 11/11/15 0504  . morphine 2 MG/ML injection 1 mg  1 mg Intravenous Q3H PRN Bonnielee Haff, MD   1 mg at 11/10/15 1940  . ondansetron (ZOFRAN) tablet 4 mg  4 mg Oral Q6H PRN Bonnielee Haff, MD       Or  . ondansetron (ZOFRAN) injection 4 mg  4 mg Intravenous Q6H PRN Bonnielee Haff, MD   4 mg at 11/11/15 1040  . oxyCODONE (Oxy IR/ROXICODONE) immediate  release tablet 5 mg  5 mg Oral Q4H PRN Bonnielee Haff, MD   5 mg at 11/11/15 0813    Allergies as of 11/10/2015 - Review Complete 11/10/2015  Allergen Reaction Noted  . Penicillins Anaphylaxis 07/05/2013    Family History  Problem Relation Age of Onset  . Diabetes Maternal Aunt   . Diabetes Other   . Heart disease Other     Social History   Social History  . Marital Status: Married    Spouse Name: N/A  . Number of Children: N/A  . Years of Education: N/A   Occupational History  . Not on file.   Social History Main Topics  . Smoking status: Former Research scientist (life sciences)  . Smokeless tobacco: Former Systems developer    Quit date: 10/11/2008  . Alcohol Use: No  . Drug Use: No  . Sexual Activity: Yes    Birth Control/ Protection: Post-menopausal   Other Topics Concern  . Not on file   Social History Narrative    Review of Systems: Gen: Denies fever, chills, loss of appetite, change in weight or weight loss CV: Denies chest pain, heart palpitations, syncope, edema  Resp: Denies shortness of breath with rest, cough, wheezing GI: See HPI.  Derm: Denies rash, itching, dry skin Psych: Denies depression, anxiety,confusion, or memory loss Heme: Denies bruising, bleeding.  Physical Exam: Vital signs in last 24 hours: Temp:  [98 F (36.7 C)-98.3 F (36.8 C)] 98.1 F (36.7 C) (12/01 0544) Pulse Rate:  [65-89] 71 (12/01 0544) Resp:  [20] 20 (12/01 0544) BP: (110-150)/(57-81) 127/81 mmHg (12/01 0544) SpO2:  [92 %-98 %] 95 % (12/01 1155) Last BM Date: 12/09/15 General:   Alert,  Well-developed, well-nourished, pleasant and cooperative in NAD. Appears well. Head:  Normocephalic and atraumatic. Eyes:  Sclera clear, no icterus. Conjunctiva pink. Ears:  Normal auditory acuity. Lungs:  Clear throughout to auscultation.   No wheezes, crackles, or rhonchi. No acute distress. Heart:  Regular rate and rhythm; no murmurs, clicks, rubs,  or gallops. Abdomen:  Obese/rounded but soft, and nondistended. RUQ  TTP. Normal bowel sounds, without guarding, and without rebound. No jaundice noted. Rectal:  Deferred.   Pulses:  Normal DP pulses noted. Extremities:  Without clubbing or edema. Neurologic:  Alert and  oriented x4;  grossly normal neurologically. Psych:  Alert and cooperative. Normal mood and affect.  Intake/Output from previous day: 11/30 0701 - 12/01 0700 In: 240 [P.O.:240] Out: -  Intake/Output this shift: Total I/O In: 720 [P.O.:720] Out: 800 [Urine:800]  Lab Results:  Recent Labs  11/10/15 0355 11/11/15 0702  WBC 8.7 7.1  HGB 13.8 11.8*  HCT 42.3 36.7  PLT 246 228   BMET  Recent Labs  11/10/15 0355 11/11/15 0702  NA 137 141  K 3.8 4.0  CL 99* 108  CO2 26 28  GLUCOSE 147* 122*  BUN 16 9  CREATININE 0.48 0.42*  CALCIUM 8.8* 8.3*  LFT  Recent Labs  11/10/15 0355 11/10/15 1120 11/11/15 0702  PROT 7.9  --  6.8  ALBUMIN 4.2  --  3.4*  AST 1385*  --  402*  ALT 1039*  --  804*  ALKPHOS 236*  --  206*  BILITOT 2.5*  --  3.6*  BILIDIR  --  1.0* 2.0*   PT/INR  Recent Labs  11/11/15 0702  LABPROT 14.8  INR 1.14   Hepatitis Panel No results for input(s): HEPBSAG, HCVAB, HEPAIGM, HEPBIGM in the last 72 hours. C-Diff No results for input(s): CDIFFTOX in the last 72 hours.  Studies/Results: Ct Abdomen Pelvis Wo Contrast  11/10/2015  CLINICAL DATA:  Sharp lower back pain EXAM: CT ABDOMEN AND PELVIS WITHOUT CONTRAST TECHNIQUE: Multidetector CT imaging of the abdomen and pelvis was performed following the standard protocol without IV contrast. COMPARISON:  None. FINDINGS: There is mild periureteral stranding on the right, possibly with a 3 mm calculus within the urinary bladder lumen. No conclusive obstructing ureteral calculus is evident at this time. No collecting system calculi. There are unremarkable unenhanced appearances of the liver, gallbladder, pancreas, spleen, adrenals and left kidney. Bowel is unremarkable. Uterus and ovaries are unremarkable.  No focal inflammatory changes are evident in the abdomen or pelvis. The abdominal aorta is normal in caliber with mild atherosclerotic calcification. There is no significant abnormality in the lower chest. There is no significant musculoskeletal lesion. Moderate facet arthropathy is present bilaterally at L5-S1. IMPRESSION: Mild periureteral stranding on the right, perhaps representing recent stone passage. There may be a 3 mm calculus in the urinary bladder lumen but no conclusive obstructing ureteral calculus is present this time. No other significant abnormality is evident. Electronically Signed   By: Andreas Newport M.D.   On: 11/10/2015 05:39   US Abdomen Limited Ruq  11/10/2015  CLINICAL DATA:  One day history of right upper quadrant pain EXAM: US ABDOMEN LIMITED - RIGHT UPPER QUADRANT COMPARISON:  CT abdomen and pelvis November 10, 2015 FINDINGS: Gallbladder: Within the gallbladder, there are multiple echogenic foci which move and shadow consistent with cholelithiasis. The largest gallstone measures 1.8 cm. The gallbladder wall is mildly thickened. No pericholecystic fluid is seen. No sonographic Murphy sign noted. Common bile duct: Diameter: 3 mm. There is no intrahepatic or extrahepatic biliary duct dilatation. Liver: No focal lesion identified. The liver echogenicity is overall increased. IMPRESSION: Gallstones with gallbladder wall thickening. These findings are concerning for early acute cholecystitis. Note that there is no pericholecystic fluid, and the patient is not focally tender over the gallbladder. Increased liver echogenicity diffusely, most likely due to hepatic steatosis. While no focal liver lesions are identified, it must be cautioned that the sensitivity of ultrasound for focal liver lesions is diminished in this circumstance. Electronically Signed   By: Lowella Grip III M.D.   On: 11/10/2015 08:39    Impression: 59 year old female with demonstrated cholelithiasis and probably  early acute cholecystitis with a sudden increase in LFTs which had reduced by nearly half overnight. No stone noted in CBD and no CBD dilation noted. Has a remote history of Hep A, acute Hepatitis panel pending. Although her LFTs are a bit higher then we would expect, given hemodynamic stability and unlikely shock liver, she seems most likely to have passed a stone or fragments. Continued elevation in bilirubin noted, but it is not uncommong for decrease in bili to lag behind decrease in AST/ALT. Alk Phos also improving. She appears well, feeling better today. Hepatic steatosis  likely related to fatty liver/obesity. Not sure of value of MRCP at this point given cholelithiasis and a drastic reduction in her LFTs.  Plan: 1. Monitor LFTs closely 2. Can consider MRCP if worsening LFTs or return/worsening of symptoms. 3. Will likely ultimately need cholecystectomy, possibly as an outpatient 4. Continue pain management and other supportive measures    Walden Field, AGNP-C Adult & Gerontological Nurse Practitioner [facility]    LOS: 1 day     11/11/2015, 1:17 PM

## 2015-11-11 NOTE — Progress Notes (Signed)
Subjective: Patient has clinically improved. Her back and abdominal pain have significantly decreased. No nausea is noted.  Objective: Vital signs in last 24 hours: Temp:  [98 F (36.7 C)-98.3 F (36.8 C)] 98.1 F (36.7 C) (12/01 0544) Pulse Rate:  [65-89] 71 (12/01 0544) Resp:  [20] 20 (12/01 0544) BP: (110-150)/(57-81) 127/81 mmHg (12/01 0544) SpO2:  [92 %-99 %] 96 % (12/01 0544) Last BM Date: 12/09/15  Intake/Output from previous day: 11/30 0701 - 12/01 0700 In: 240 [P.O.:240] Out: -  Intake/Output this shift:    General appearance: alert, cooperative and no distress GI: soft, non-tender; bowel sounds normal; no masses,  no organomegaly  Lab Results:   Recent Labs  11/10/15 0355 11/11/15 0702  WBC 8.7 7.1  HGB 13.8 11.8*  HCT 42.3 36.7  PLT 246 228   BMET  Recent Labs  11/10/15 0355 11/11/15 0702  NA 137 141  K 3.8 4.0  CL 99* 108  CO2 26 28  GLUCOSE 147* 122*  BUN 16 9  CREATININE 0.48 0.42*  CALCIUM 8.8* 8.3*   PT/INR  Recent Labs  11/11/15 0702  LABPROT 14.8  INR 1.14    Studies/Results: Ct Abdomen Pelvis Wo Contrast  11/10/2015  CLINICAL DATA:  Sharp lower back pain EXAM: CT ABDOMEN AND PELVIS WITHOUT CONTRAST TECHNIQUE: Multidetector CT imaging of the abdomen and pelvis was performed following the standard protocol without IV contrast. COMPARISON:  None. FINDINGS: There is mild periureteral stranding on the right, possibly with a 3 mm calculus within the urinary bladder lumen. No conclusive obstructing ureteral calculus is evident at this time. No collecting system calculi. There are unremarkable unenhanced appearances of the liver, gallbladder, pancreas, spleen, adrenals and left kidney. Bowel is unremarkable. Uterus and ovaries are unremarkable. No focal inflammatory changes are evident in the abdomen or pelvis. The abdominal aorta is normal in caliber with mild atherosclerotic calcification. There is no significant abnormality in the lower  chest. There is no significant musculoskeletal lesion. Moderate facet arthropathy is present bilaterally at L5-S1. IMPRESSION: Mild periureteral stranding on the right, perhaps representing recent stone passage. There may be a 3 mm calculus in the urinary bladder lumen but no conclusive obstructing ureteral calculus is present this time. No other significant abnormality is evident. Electronically Signed   By: Ellery Plunk M.D.   On: 11/10/2015 05:39   US Abdomen Limited Ruq  11/10/2015  CLINICAL DATA:  One day history of right upper quadrant pain EXAM: US ABDOMEN LIMITED - RIGHT UPPER QUADRANT COMPARISON:  CT abdomen and pelvis November 10, 2015 FINDINGS: Gallbladder: Within the gallbladder, there are multiple echogenic foci which move and shadow consistent with cholelithiasis. The largest gallstone measures 1.8 cm. The gallbladder wall is mildly thickened. No pericholecystic fluid is seen. No sonographic Murphy sign noted. Common bile duct: Diameter: 3 mm. There is no intrahepatic or extrahepatic biliary duct dilatation. Liver: No focal lesion identified. The liver echogenicity is overall increased. IMPRESSION: Gallstones with gallbladder wall thickening. These findings are concerning for early acute cholecystitis. Note that there is no pericholecystic fluid, and the patient is not focally tender over the gallbladder. Increased liver echogenicity diffusely, most likely due to hepatic steatosis. While no focal liver lesions are identified, it must be cautioned that the sensitivity of ultrasound for focal liver lesions is diminished in this circumstance. Electronically Signed   By: Bretta Bang III M.D.   On: 11/10/2015 08:39    Anti-infectives: Anti-infectives    Start     Dose/Rate Route  Frequency Ordered Stop   11/10/15 1545  ciprofloxacin (CIPRO) IVPB 400 mg     400 mg 200 mL/hr over 60 Minutes Intravenous Every 12 hours 11/10/15 1543        Assessment/Plan: Impression: Cholecystitis,  cholelithiasis. She does have a history of hepatitis A. Her liver enzyme tests are decreasing, though her total bilirubin has increased. Will get GI consultation to complete workup. Patient may need MRCP. Plan: Further management pending GI consultation.  LOS: 1 day    Tully Mcinturff A 11/11/2015

## 2015-11-11 NOTE — Progress Notes (Signed)
[facility] PROGRESS NOTE  CADE OLBERDING ZOX:[id number] DOB: 1955/12/16 DOA: 11/10/2015  PCP: Willow Ora, MD  Brief HPI: This is a 59 year old Caucasian female with a past medical history of depression and anxiety, hypertension, obesity, who presented with abdominal pain radiating to the back. She was found to have cholelithiasis with possible cholecystitis. She was noted to have transaminitis. She was hospitalized for further management.  Past medical history:  Past Medical History  Diagnosis Date  . Hypertension   . Asthma   . Depression     Consultants: Gastroenterology. General surgery.  Procedures: None yet  Antibiotics: Ciprofloxacin 11/30  Subjective: Patient feels better this morning. Does not have as much abdominal pain as yesterday. No nausea, vomiting. No chest pain, shortness of breath.  Objective: Vital Signs  Filed Vitals:   11/10/15 1429 11/10/15 1559 11/10/15 2146 11/11/15 0544  BP: 110/66 150/77 132/70 127/81  Pulse: 89 67 65 71  Temp: 98 F (36.7 C) 98.3 F (36.8 C) 98 F (36.7 C) 98.1 F (36.7 C)  TempSrc: Oral Oral Oral Oral  Resp: Height:      Weight:      SpO2: 97% 96% 98% 96%    Intake/Output Summary (Last 24 hours) at 11/11/15 0935 Last data filed at 11/11/15 8119  Gross per 24 hour  Intake    960 ml  Output    800 ml  Net    160 ml   Filed Weights   11/10/15 0353  Weight: 113.399 kg (250 lb)    General appearance: alert, cooperative, appears stated age and no distress Resp: clear to auscultation bilaterally Cardio: regular rate and rhythm, S1, S2 normal, no murmur, click, rub or gallop GI: Soft. Obese. Tender in the right upper quadrant. No rebound, rigidity or guarding. No masses or organomegaly. Extremities: extremities normal, atraumatic, no cyanosis or edema Neurologic: Alert and oriented X 3, normal strength and tone. Cranial nerves II-12 intact.  Lab Results:  Basic Metabolic  Panel:  Recent Labs Lab 11/10/15 0355 11/11/15 0702  NA 137 141  K 3.8 4.0  CL 99* 108  CO2 26 28  GLUCOSE 147* 122*  BUN 16 9  CREATININE 0.48 0.42*  CALCIUM 8.8* 8.3*   Liver Function Tests:  Recent Labs Lab 11/10/15 0355 11/11/15 0702  AST 1385* 402*  ALT 1039* 804*  ALKPHOS 236* 206*  BILITOT 2.5* 3.6*  PROT 7.9 6.8  ALBUMIN 4.2 3.4*    Recent Labs Lab 11/10/15 0355  LIPASE 29   CBC:  Recent Labs Lab 11/10/15 0355 11/11/15 0702  WBC 8.7 7.1  NEUTROABS 7.3  --   HGB 13.8 11.8*  HCT 42.3 36.7  MCV 92.6 93.6  PLT 246 228     Studies/Results: Ct Abdomen Pelvis Wo Contrast  11/10/2015  CLINICAL DATA:  Sharp lower back pain EXAM: CT ABDOMEN AND PELVIS WITHOUT CONTRAST TECHNIQUE: Multidetector CT imaging of the abdomen and pelvis was performed following the standard protocol without IV contrast. COMPARISON:  None. FINDINGS: There is mild periureteral stranding on the right, possibly with a 3 mm calculus within the urinary bladder lumen. No conclusive obstructing ureteral calculus is evident at this time. No collecting system calculi. There are unremarkable unenhanced appearances of the liver, gallbladder, pancreas, spleen, adrenals and left kidney. Bowel is unremarkable. Uterus and ovaries are unremarkable. No focal inflammatory changes are evident in the abdomen or pelvis. The abdominal aorta is normal in caliber with mild atherosclerotic calcification. There is  no significant abnormality in the lower chest. There is no significant musculoskeletal lesion. Moderate facet arthropathy is present bilaterally at L5-S1. IMPRESSION: Mild periureteral stranding on the right, perhaps representing recent stone passage. There may be a 3 mm calculus in the urinary bladder lumen but no conclusive obstructing ureteral calculus is present this time. No other significant abnormality is evident. Electronically Signed   By: Ellery Plunkaniel R Mitchell M.D.   On: 11/10/2015 05:39   [place] Abdomen  Limited Ruq  11/10/2015  CLINICAL DATA:  One day history of right upper quadrant pain EXAM: US ABDOMEN LIMITED - RIGHT UPPER QUADRANT COMPARISON:  CT abdomen and pelvis November 10, 2015 FINDINGS: Gallbladder: Within the gallbladder, there are multiple echogenic foci which move and shadow consistent with cholelithiasis. The largest gallstone measures 1.8 cm. The gallbladder wall is mildly thickened. No pericholecystic fluid is seen. No sonographic Murphy sign noted. Common bile duct: Diameter: 3 mm. There is no intrahepatic or extrahepatic biliary duct dilatation. Liver: No focal lesion identified. The liver echogenicity is overall increased. IMPRESSION: Gallstones with gallbladder wall thickening. These findings are concerning for early acute cholecystitis. Note that there is no pericholecystic fluid, and the patient is not focally tender over the gallbladder. Increased liver echogenicity diffusely, most likely due to hepatic steatosis. While no focal liver lesions are identified, it must be cautioned that the sensitivity of ultrasound for focal liver lesions is diminished in this circumstance. Electronically Signed   By: Bretta BangWilliam  Woodruff III M.D.   On: 11/10/2015 08:39    Medications:  Scheduled: . ciprofloxacin  400 mg Intravenous Q12H  . escitalopram  10 mg Oral Daily  . fluticasone  1 spray Each Nare Daily  . heparin  5,000 Units Subcutaneous 3 times per day   Continuous: . sodium chloride 75 mL/hr at 11/10/15 1617   WGN:FAOZHYQMVPRN:albuterol, morphine injection, ondansetron **OR** ondansetron (ZOFRAN) IV, oxyCODONE  Assessment/Plan:  Principal Problem:   Acute cholecystitis Active Problems:   OBESITY NOS   Essential hypertension   Depression   Elevated liver enzymes   Hyperglycemia    Upper abdominal pain with transaminitis and possible cholecystitis Patient feels better this morning. Continue ciprofloxacin for now. Bilirubin noted to be higher today compared to yesterday. Discussed with  Dr. Lovell SheehanJenkins with general surgery. He recommends getting a GI consult. Continue clear liquid diet for now. Hepatitis panel is pending. Transaminases are improved.  Perinephric stranding observed on CT scan No obstructing calculus noted. Continue to monitor clinically.  History of essential hypertension Patient states that she hasn't taken her blood pressure medicine in about 2 months. She doesn't know the name of this medicine, either. Blood pressure is reasonably well controlled. Continue to monitor.  History of anxiety and depression Continue Lexapro  Hyperglycemia Fasting level 122 this morning. HbA1c is 5.8. She will need continued monitoring as an outpatient. She may benefit from being placed on metformin once she is over her acute illness. She will also need to have a lipid panel checked. All these can be pursued as an outpatient.  DVT Prophylaxis: Subcutaneous heparin    Code Status: Full code  Family Communication: Discussed with the patient  Disposition Plan: Await GI input.     LOS: 1 day   [facility] NewbergKRISHNAN,Anvay Tennis  [facility] Pager [phone] 11/11/2015, 9:35 AM  If 7PM-7AM, please contact night-coverage at [email or web address], password [email or web address]

## 2015-11-12 ENCOUNTER — Inpatient Hospital Stay (HOSPITAL_COMMUNITY): Payer: Managed Care, Other (non HMO)

## 2015-11-12 DIAGNOSIS — M545 Low back pain, unspecified: Secondary | ICD-10-CM | POA: Insufficient documentation

## 2015-11-12 LAB — CBC
HEMATOCRIT: 37.2 % (ref 36.0–46.0)
HEMOGLOBIN: 11.9 g/dL — AB (ref 12.0–15.0)
MCH: 30.2 pg (ref 26.0–34.0)
MCHC: 32 g/dL (ref 30.0–36.0)
MCV: 94.4 fL (ref 78.0–100.0)
Platelets: 244 10*3/uL (ref 150–400)
RBC: 3.94 MIL/uL (ref 3.87–5.11)
RDW: 13.8 % (ref 11.5–15.5)
WBC: 6.2 10*3/uL (ref 4.0–10.5)

## 2015-11-12 LAB — HEPATITIS PANEL, ACUTE
HEP A IGM: NEGATIVE
HEP B S AG: NEGATIVE
Hep B C IgM: NEGATIVE

## 2015-11-12 LAB — HEPATIC FUNCTION PANEL
ALBUMIN: 3.6 g/dL (ref 3.5–5.0)
ALK PHOS: 190 U/L — AB (ref 38–126)
ALT: 605 U/L — AB (ref 14–54)
AST: 194 U/L — AB (ref 15–41)
BILIRUBIN DIRECT: 0.5 mg/dL (ref 0.1–0.5)
BILIRUBIN TOTAL: 1.3 mg/dL — AB (ref 0.3–1.2)
Indirect Bilirubin: 0.8 mg/dL (ref 0.3–0.9)
Total Protein: 7.1 g/dL (ref 6.5–8.1)

## 2015-11-12 MED ORDER — AMLODIPINE BESYLATE 5 MG PO TABS
5.0000 mg | ORAL_TABLET | Freq: Every day | ORAL | Status: DC
  Administered 2015-11-12 – 2015-11-13 (×2): 5 mg via ORAL
  Filled 2015-11-12 (×2): qty 1

## 2015-11-12 MED ORDER — CIPROFLOXACIN HCL 250 MG PO TABS
500.0000 mg | ORAL_TABLET | Freq: Two times a day (BID) | ORAL | Status: DC
  Administered 2015-11-12 – 2015-11-13 (×2): 500 mg via ORAL
  Filled 2015-11-12 (×2): qty 2

## 2015-11-12 NOTE — Progress Notes (Signed)
Subjective: Clinically continues to do well. Feels good. Has one episode o back pain late last night which was adequately controlled with pain medication. No further abdominal pain, N/V.  Objective: Vital signs in last 24 hours: Temp:  [98.4 F (36.9 C)-98.9 F (37.2 C)] 98.4 F (36.9 C) (12/02 0448) Pulse Rate:  [62-80] 80 (12/02 0448) Resp:  [20] 20 (12/02 0448) BP: (152-200)/(68-91) 163/68 mmHg (12/02 1237) SpO2:  [94 %-100 %] 94 % (12/02 1128) Last BM Date: 12/09/15 General:   Alert and oriented, pleasant, sitting at bedside. Head:  Normocephalic and atraumatic. Eyes:  No icterus, sclera clear. Conjuctiva pink.  Heart:  S1, S2 present, no murmurs noted.  Lungs: Clear to auscultation bilaterally, without wheezing, rales, or rhonchi.  Abdomen:  Bowel sounds present, rounded but soft, non-tender, non-distended. No HSM or hernias noted. No rebound or guarding. No masses appreciated  Extremities:  Without clubbing or edema. Neurologic:  Alert and  oriented x4;  grossly normal neurologically. Skin:  Warm and dry, intact without significant lesions.  Psych:  Alert and cooperative. Normal mood and affect.  Intake/Output from previous day: 12/01 0701 - 12/02 0700 In: 2281.3 [P.O.:1200; I.V.:881.3; IV Piggyback:200] Out: 1600 [Urine:1600] Intake/Output this shift:    Lab Results:  Recent Labs  11/10/15 0355 11/11/15 0702 11/12/15 0614  WBC 8.7 7.1 6.2  HGB 13.8 11.8* 11.9*  HCT 42.3 36.7 37.2  PLT 246 228 244   BMET  Recent Labs  11/10/15 0355 11/11/15 0702  NA 137 141  K 3.8 4.0  CL 99* 108  CO2 26 28  GLUCOSE 147* 122*  BUN 16 9  CREATININE 0.48 0.42*  CALCIUM 8.8* 8.3*   LFT  Recent Labs  11/10/15 0355 11/10/15 1120 11/11/15 0702 11/12/15 0614  PROT 7.9  --  6.8 7.1  ALBUMIN 4.2  --  3.4* 3.6  AST 1385*  --  402* 194*  ALT 1039*  --  804* 605*  ALKPHOS 236*  --  206* 190*  BILITOT 2.5*  --  3.6* 1.3*  BILIDIR  --  1.0* 2.0* 0.5  IBILI  --    --   --  0.8   PT/INR  Recent Labs  11/11/15 0702  LABPROT 14.8  INR 1.14   Hepatitis Panel  Recent Labs  11/11/15 0702  HEPBSAG Negative  HCVAB <0.1  HEPAIGM Negative  HEPBIGM Negative    Studies/Results: Dg Chest 2 View  11/12/2015  CLINICAL DATA:  Hx of htn. She said that her bp went up quite a bit yesterday when she was told that she was going to have her gallbladder removed. No other chest complaints. Former smoker. EXAM: CHEST  2 VIEW COMPARISON:  10/27/2008 FINDINGS: There is prominence of interstitial markings, stable in appearance. Heart size is normal. There are no focal consolidations. No pleural effusions or pulmonary edema. IMPRESSION: Stable appearance of the chest.  Prominent interstitial markings. Electronically Signed   By: Norva Pavlov M.D.   On: 11/12/2015 11:24    Assessment: 59 year old female with demonstrated cholelithiasis and probably early acute cholecystitis with a sudden increase in LFTs which had reduced by nearly half overnight. No stone noted in CBD and no CBD dilation noted. Has a remote history of Hep A, acute Hepatitis panel pending. Although her LFTs are a bit higher then we would expect, given hemodynamic stability and unlikely shock liver, she seems most likely to have passed a stone or fragments. Hepatic steatosis likely related to fatty liver/obesity.  Continues to improved today with another significant overnight decrease in LFTs. Clinically doing well, does not appear ill. Minimal to no pain. No N/V. At this point it seems she has passed a stone and is resolving clinically. Recommend proceed with surgical evaluation and procedure per surgeon.   Plan: 1. Continue supportive measures 2. Cholecystectomy per general surgery 3. Continue to monitor LFTs. 4. Appears stable for discharge from GI standpoint.    Wynne DustEric Garrell Flagg, AGNP-C Adult & Gerontological Nurse Practitioner [facility]    LOS: 2 days     11/12/2015, 1:06 PM

## 2015-11-12 NOTE — Progress Notes (Signed)
[facility] PROGRESS NOTE  Marlow BaarsJanice N Denomme WGN:[id number] DOB: February 23, 1956 DOA: 11/10/2015  PCP: Willow OraANDY,CAMILLE L, MD  Brief HPI: This is a 59 year old Caucasian female with a past medical history of depression and anxiety, hypertension, obesity, who presented with abdominal pain radiating to the back. She was found to have cholelithiasis with possible cholecystitis. She was noted to have transaminitis. She was hospitalized for further management.  Past medical history:  Past Medical History  Diagnosis Date  . Hypertension   . Asthma   . Depression     Consultants: Gastroenterology. General surgery.  Procedures: None yet  Antibiotics: Ciprofloxacin 11/30  Subjective: Patient had an episode of nausea and back pain yesterday. This morning she feels well. No nausea, vomiting. Tolerated her breakfast.   Objective: Vital Signs  Filed Vitals:   11/11/15 1155 11/11/15 1512 11/11/15 2138 11/12/15 0448  BP:  200/91 172/75 152/72  Pulse:  67 62 80  Temp:  98.6 F (37 C) 98.9 F (37.2 C) 98.4 F (36.9 C)  TempSrc:  Oral Oral Oral  Resp:   20 20  Height:      Weight:      SpO2: 95% 99% 100% 96%    Intake/Output Summary (Last 24 hours) at 11/12/15 1119 Last data filed at 11/11/15 1845  Gross per 24 hour  Intake 1561.25 ml  Output    800 ml  Net 761.25 ml   Filed Weights   11/10/15 0353  Weight: 113.399 kg (250 lb)    General appearance: alert, cooperative, appears stated age and no distress Resp: clear to auscultation bilaterally Cardio: regular rate and rhythm, S1, S2 normal, no murmur, click, rub or gallop GI: Soft. Obese. Much improved examination with less tenderness in the right upper quadrant. No rebound, rigidity or guarding. No masses or organomegaly. Extremities: extremities normal, atraumatic, no cyanosis or edema Neurologic: Alert and oriented X 3, normal strength and tone. Cranial nerves II-12 intact.  Lab Results:  Basic Metabolic  Panel:  Recent Labs Lab 11/10/15 0355 11/11/15 0702  NA 137 141  K 3.8 4.0  CL 99* 108  CO2 26 28  GLUCOSE 147* 122*  BUN 16 9  CREATININE 0.48 0.42*  CALCIUM 8.8* 8.3*   Liver Function Tests:  Recent Labs Lab 11/10/15 0355 11/11/15 0702 11/12/15 0614  AST 1385* 402* 194*  ALT 1039* 804* 605*  ALKPHOS 236* 206* 190*  BILITOT 2.5* 3.6* 1.3*  PROT 7.9 6.8 7.1  ALBUMIN 4.2 3.4* 3.6    Recent Labs Lab 11/10/15 0355  LIPASE 29   CBC:  Recent Labs Lab 11/10/15 0355 11/11/15 0702 11/12/15 0614  WBC 8.7 7.1 6.2  NEUTROABS 7.3  --   --   HGB 13.8 11.8* 11.9*  HCT 42.3 36.7 37.2  MCV 92.6 93.6 94.4  PLT 246 228 244     Studies/Results: No results found.  Medications:  Scheduled: . ciprofloxacin  400 mg Intravenous Q12H  . escitalopram  10 mg Oral Daily  . fluticasone  1 spray Each Nare Daily  . heparin  5,000 Units Subcutaneous 3 times per day   Continuous: . sodium chloride 75 mL/hr at 11/10/15 1617   HQI:ONGEXBMWUPRN:albuterol, morphine injection, ondansetron **OR** ondansetron (ZOFRAN) IV, oxyCODONE  Assessment/Plan:  Principal Problem:   Acute cholecystitis Active Problems:   OBESITY NOS   Essential hypertension   Depression   Elevated liver enzymes   Hyperglycemia   AP (abdominal pain)   Elevated liver function tests   Emesis  Upper abdominal pain with transaminitis and possible cholecystitis Patient continues to improve. Bilirubin is improving. LFTs are improving. Continue ciprofloxacin for now. Seen by general surgery this morning. Discussed with Dr. Lovell Sheehan. Monitor for 1 more day. If LFTs continues to improve and if she remains well from a symptom perspective, she may be able to go home tomorrow and a cholecystectomy will be scheduled for sometime next week. Hepatitis panel is negative. Patient also seen by gastroenterology. They do not plan any further workup.  Perinephric stranding observed on CT scan No obstructing calculus noted. Continue  to monitor clinically.  History of essential hypertension Patient states that she hasn't taken her blood pressure medicine in about 2 months. She doesn't know the name of this medicine, either. Blood pressure noted to be elevated since yesterday afternoon. Initiate amlodipine.  History of anxiety and depression Continue Lexapro  Hyperglycemia Fasting level 122. HbA1c is 5.8. She will need continued monitoring as an outpatient. She may benefit from being placed on metformin once she is over her acute illness. She will also need to have a lipid panel checked. All these can be pursued as an outpatient.  DVT Prophylaxis: Subcutaneous heparin    Code Status: Full code  Family Communication: Discussed with the patient  Disposition Plan: Continue current treatment. Anticipate discharge tomorrow if her LFTs continue to improve.     LOS: 2 days   [facility]  [facility] Pager [phone] 11/12/2015, 11:19 AM  If 7PM-7AM, please contact night-coverage at [email or web address], password [facility]

## 2015-11-12 NOTE — Care Management Note (Signed)
Case Management Note  Patient Details  Name: Erin BaarsJanice N Burditt MRN: [id number] Date of Birth: Jun 14, 1956  Subjective/Objective:                    Action/Plan:   Expected Discharge Date:  11/12/15               Expected Discharge Plan:  Home/Self Care  In-House Referral:  NA  Discharge planning Services  CM Consult  Post Acute Care Choice:  NA Choice offered to:  NA  DME Arranged:    DME Agency:     HH Arranged:    HH Agency:     Status of Service:  Completed, signed off  Medicare Important Message Given:    Date Medicare IM Given:    Medicare IM give by:    Date Additional Medicare IM Given:    Additional Medicare Important Message give by:     If discussed at Long Length of Stay Meetings, dates discussed:    Additional Comments: Anticipate discharge within 24 hours. No CM needs noted. Arlyss QueenBlackwell, Michail Boyte Springfieldrowder, RN 11/12/2015, 12:50 PM

## 2015-11-12 NOTE — Progress Notes (Signed)
**Note De-identified Saif Peter Obfuscation** Abnormal EKG reported to RN 

## 2015-11-12 NOTE — Progress Notes (Signed)
  Subjective: Patient did have episode of nausea and epigastric pain yesterday evening. This has since improved.  Objective: Vital signs in last 24 hours: Temp:  [98.4 F (36.9 C)-98.9 F (37.2 C)] 98.4 F (36.9 C) (12/02 0448) Pulse Rate:  [62-80] 80 (12/02 0448) Resp:  [20] 20 (12/02 0448) BP: (152-200)/(72-91) 152/72 mmHg (12/02 0448) SpO2:  [95 %-100 %] 96 % (12/02 0448) Last BM Date: 12/09/15  Intake/Output from previous day: 12/01 0701 - 12/02 0700 In: 2281.3 [P.O.:1200; I.V.:881.3; IV Piggyback:200] Out: 1600 [Urine:1600] Intake/Output this shift:    General appearance: alert, cooperative and no distress GI: Soft, minimal tenderness in epigastric and right upper quadrant region. No rigidity is noted.  Lab Results:   Recent Labs  11/11/15 0702 11/12/15 0614  WBC 7.1 6.2  HGB 11.8* 11.9*  HCT 36.7 37.2  PLT 228 244   BMET  Recent Labs  11/10/15 0355 11/11/15 0702  NA 137 141  K 3.8 4.0  CL 99* 108  CO2 26 28  GLUCOSE 147* 122*  BUN 16 9  CREATININE 0.48 0.42*  CALCIUM 8.8* 8.3*   PT/INR  Recent Labs  11/11/15 0702  LABPROT 14.8  INR 1.14    Studies/Results: No results found.  Anti-infectives: Anti-infectives    Start     Dose/Rate Route Frequency Ordered Stop   11/10/15 1545  ciprofloxacin (CIPRO) IVPB 400 mg     400 mg 200 mL/hr over 60 Minutes Intravenous Every 12 hours 11/10/15 1543        Assessment/Plan: Impression: Cholecystitis, cholelithiasis, slowly improving. Hyperbilirubinemia is slowly improving. Hepatitis panel is pending. Plan: Will defer surgery for today. Have placed on heart healthy diet. Will reevaluate liver enzymes in [place]elez.m.  LOS: 2 days    [name] 11/12/2015

## 2015-11-13 LAB — CBC
HEMATOCRIT: 39.2 % (ref 36.0–46.0)
HEMOGLOBIN: 12.4 g/dL (ref 12.0–15.0)
MCH: 30 pg (ref 26.0–34.0)
MCHC: 31.6 g/dL (ref 30.0–36.0)
MCV: 94.7 fL (ref 78.0–100.0)
Platelets: 269 10*3/uL (ref 150–400)
RBC: 4.14 MIL/uL (ref 3.87–5.11)
RDW: 14 % (ref 11.5–15.5)
WBC: 6.3 10*3/uL (ref 4.0–10.5)

## 2015-11-13 LAB — HEPATIC FUNCTION PANEL
ALBUMIN: 3.7 g/dL (ref 3.5–5.0)
ALK PHOS: 200 U/L — AB (ref 38–126)
ALT: 546 U/L — AB (ref 14–54)
AST: 193 U/L — ABNORMAL HIGH (ref 15–41)
BILIRUBIN DIRECT: 0.5 mg/dL (ref 0.1–0.5)
BILIRUBIN TOTAL: 1.2 mg/dL (ref 0.3–1.2)
Indirect Bilirubin: 0.7 mg/dL (ref 0.3–0.9)
Total Protein: 7.3 g/dL (ref 6.5–8.1)

## 2015-11-13 MED ORDER — OXYCODONE HCL 5 MG PO TABS: 5.0000 mg | ORAL_TABLET | ORAL | Status: DC | PRN

## 2015-11-13 MED ORDER — AMLODIPINE BESYLATE 10 MG PO TABS: 10.0000 mg | ORAL_TABLET | Freq: Every day | ORAL | Status: AC

## 2015-11-13 MED ORDER — CIPROFLOXACIN HCL 500 MG PO TABS: 500.0000 mg | ORAL_TABLET | Freq: Two times a day (BID) | ORAL | Status: AC

## 2015-11-13 NOTE — Progress Notes (Signed)
  Subjective: Patient tolerated diet well. No significant abdominal pain or nausea.  Objective: Vital signs in last 24 hours: Temp:  [98.1 F (36.7 C)-98.4 F (36.9 C)] 98.4 F (36.9 C) (12/03 95620613) Pulse Rate:  [64-66] 65 (12/03 0613) Resp:  [20] 20 (12/03 0613) BP: (147-178)/(68-80) 170/74 mmHg (12/03 0613) SpO2:  [94 %-98 %] 98 % (12/03 0613) Last BM Date: 12/09/15  Intake/Output from previous day: 12/02 0701 - 12/03 0700 In: 1080 [P.O.:1080] Out: 1650 [Urine:1650] Intake/Output this shift:    General appearance: alert, cooperative and no distress GI: soft, non-tender; bowel sounds normal; no masses,  no organomegaly  Lab Results:   Recent Labs  11/12/15 0614 11/13/15 0626  WBC 6.2 6.3  HGB 11.9* 12.4  HCT 37.2 39.2  PLT 244 269   BMET  Recent Labs  11/11/15 0702  NA 141  K 4.0  CL 108  CO2 28  GLUCOSE 122*  BUN 9  CREATININE 0.42*  CALCIUM 8.3*   PT/INR  Recent Labs  11/11/15 0702  LABPROT 14.8  INR 1.14    Studies/Results: Dg Chest 2 View  11/12/2015  CLINICAL DATA:  Hx of htn. She said that her bp went up quite a bit yesterday when she was told that she was going to have her gallbladder removed. No other chest complaints. Former smoker. EXAM: CHEST  2 VIEW COMPARISON:  10/27/2008 FINDINGS: There is prominence of interstitial markings, stable in appearance. Heart size is normal. There are no focal consolidations. No pleural effusions or pulmonary edema. IMPRESSION: Stable appearance of the chest.  Prominent interstitial markings. Electronically Signed   By: Norva PavlovElizabeth  Brown M.D.   On: 11/12/2015 11:24    Anti-infectives: Anti-infectives    Start     Dose/Rate Route Frequency Ordered Stop   11/12/15 2000  ciprofloxacin (CIPRO) tablet 500 mg     500 mg Oral 2 times daily 11/12/15 1501     11/10/15 1545  ciprofloxacin (CIPRO) IVPB 400 mg  Status:  Discontinued     400 mg 200 mL/hr over 60 Minutes Intravenous Every 12 hours 11/10/15 1543  11/12/15 1501      Assessment/Plan: Impression: Cholecystitis, cholelithiasis, resolving Plan: Patient would like to be discharged and do her surgery as an outpatient. This is fine with me. Patient has been scheduled for laparoscopic cholecystectomy with cholangiograms for 11/19/2015. The risks and benefits of the procedure including bleeding, infection, hepatobiliary injury, and the possibility of an open procedure were fully explained to the patient, who gave informed consent.  LOS: 3 days    Amey Hossain A 11/13/2015

## 2015-11-13 NOTE — Progress Notes (Addendum)
Patient states understanding of discharge instructions, prescriptions given. Nursing supervisor made aware of order for surgery on Friday.

## 2015-11-13 NOTE — Discharge Summary (Signed)
[facility]  Physician Discharge Summary   Patient ID: [name] MRN: [id number] DOB/AGE: 59-Jul-1957 59 y.o.  Admit date: 11/10/2015 Discharge date: 11/13/2015  PCP: ANDY,CAMILLE L, MD  DISCHARGE DIAGNOSES:  Principal Problem:   Acute cholecystitis Active Problems:   OBESITY NOS   Essential hypertension   Depression   Elevated liver enzymes   Hyperglycemia   AP (abdominal pain)   Elevated liver function tests   Emesis   Bilateral low back pain without sciatica   RECOMMENDATIONS FOR OUTPATIENT FOLLOW UP: 1. Patient to undergo cholecystectomy on Friday 2. Patient with mild hyperglycemia as discussed below. Please see recommendations outlined.   DISCHARGE CONDITION: fair  Diet recommendation: low-fat  Filed Weights   11/10/15 0353  Weight: 113.399 kg (250 lb)    INITIAL HISTORY: This is a 59 year old Caucasian female with a past medical history of depression and anxiety, hypertension, obesity, who presented with abdominal pain radiating to the back. She was found to have cholelithiasis with possible cholecystitis. She was noted to have transaminitis. She was hospitalized for further management.  Consultations:  Gastroenterology and general surgery   HOSPITAL COURSE:   Upper abdominal pain with transaminitis and possible cholecystitis Patient was admitted to the hospital. She was seen by general surgery and gastroenterology. Her LFTs were noted to be very high. Then started improving. All of this is thought to be secondary to cholecystitis. She may also have biliary colic and may have passed a stone. There was no need for MRCP per gastroenterology. She was placed on ciprofloxacin and will be discharged on the same. Plan is for cholecystectomy on Friday. Hepatitis panel was negative.  Perinephric stranding observed on CT scan No obstructing renal calculus noted. No discomfort with urination. No hematuria.  History of essential hypertension Patient  states that she hasn't taken her blood pressure medicine in about 2 months. She doesn't know the name of this medicine, either. Blood pressure noted to be elevated during this hospitalization. Amlodipine has been initiated.  History of anxiety and depression Continue Lexapro  Hyperglycemia Fasting level 122. HbA1c is 5.8. She will need continued monitoring as an outpatient. She may benefit from being placed on metformin once she is over her acute illness. She will also need to have a lipid panel checked. All these can be pursued as an outpatient.  Overall, stable and improved. Okay for discharge.   PERTINENT LABS:  The results of significant diagnostics from this hospitalization (including imaging, microbiology, ancillary and laboratory) are listed below for reference.     Labs: Basic Metabolic Panel:  Recent Labs Lab 11/10/15 0355 11/11/15 0702  NA 137 141  K 3.8 4.0  CL 99* 108  CO2 26 28  GLUCOSE 147* 122*  BUN 16 9  CREATININE 0.48 0.42*  CALCIUM 8.8* 8.3*   Liver Function Tests:  Recent Labs Lab 11/10/15 0355 11/11/15 0702 11/12/15 0614 11/13/15 0626  AST 1385* 402* 194* 193*  ALT 1039* 804* 605* 546*  ALKPHOS 236* 206* 190* 200*  BILITOT 2.5* 3.6* 1.3* 1.2  PROT 7.9 6.8 7.1 7.3  ALBUMIN 4.2 3.4* 3.6 3.7    Recent Labs Lab 11/10/15 0355  LIPASE 29   CBC:  Recent Labs Lab 11/10/15 0355 11/11/15 0702 11/12/15 0614 11/13/15 0626  WBC 8.7 7.1 6.2 6.3  NEUTROABS 7.3  --   --   --   HGB 13.8 11.8* 11.9* 12.4  HCT 42.3 36.7 37.2 39.2  MCV 92.6 93.6 94.4 94.7  PLT 246 228 244 269  IMAGING STUDIES Ct Abdomen Pelvis Wo Contrast  11/10/2015  CLINICAL DATA:  Sharp lower back pain EXAM: CT ABDOMEN AND PELVIS WITHOUT CONTRAST TECHNIQUE: Multidetector CT imaging of the abdomen and pelvis was performed following the standard protocol without IV contrast. COMPARISON:  None. FINDINGS: There is mild periureteral stranding on the right, possibly with a 3 mm  calculus within the urinary bladder lumen. No conclusive obstructing ureteral calculus is evident at this time. No collecting system calculi. There are unremarkable unenhanced appearances of the liver, gallbladder, pancreas, spleen, adrenals and left kidney. Bowel is unremarkable. Uterus and ovaries are unremarkable. No focal inflammatory changes are evident in the abdomen or pelvis. The abdominal aorta is normal in caliber with mild atherosclerotic calcification. There is no significant abnormality in the lower chest. There is no significant musculoskeletal lesion. Moderate facet arthropathy is present bilaterally at L5-S1. IMPRESSION: Mild periureteral stranding on the right, perhaps representing recent stone passage. There may be a 3 mm calculus in the urinary bladder lumen but no conclusive obstructing ureteral calculus is present this time. No other significant abnormality is evident. Electronically Signed   By: Ellery Plunk M.D.   On: 11/10/2015 05:39   Dg Chest 2 View  11/12/2015  CLINICAL DATA:  Hx of htn. She said that her bp went up quite a bit yesterday when she was told that she was going to have her gallbladder removed. No other chest complaints. Former smoker. EXAM: CHEST  2 VIEW COMPARISON:  10/27/2008 FINDINGS: There is prominence of interstitial markings, stable in appearance. Heart size is normal. There are no focal consolidations. No pleural effusions or pulmonary edema. IMPRESSION: Stable appearance of the chest.  Prominent interstitial markings. Electronically Signed   By: Norva Pavlov M.D.   On: 11/12/2015 11:24   US Abdomen Limited Ruq  11/10/2015  CLINICAL DATA:  One day history of right upper quadrant pain EXAM: US ABDOMEN LIMITED - RIGHT UPPER QUADRANT COMPARISON:  CT abdomen and pelvis November 10, 2015 FINDINGS: Gallbladder: Within the gallbladder, there are multiple echogenic foci which move and shadow consistent with cholelithiasis. The largest gallstone measures 1.8 cm.  The gallbladder wall is mildly thickened. No pericholecystic fluid is seen. No sonographic Murphy sign noted. Common bile duct: Diameter: 3 mm. There is no intrahepatic or extrahepatic biliary duct dilatation. Liver: No focal lesion identified. The liver echogenicity is overall increased. IMPRESSION: Gallstones with gallbladder wall thickening. These findings are concerning for early acute cholecystitis. Note that there is no pericholecystic fluid, and the patient is not focally tender over the gallbladder. Increased liver echogenicity diffusely, most likely due to hepatic steatosis. While no focal liver lesions are identified, it must be cautioned that the sensitivity of ultrasound for focal liver lesions is diminished in this circumstance. Electronically Signed   By: Bretta Bang III M.D.   On: 11/10/2015 08:39    DISCHARGE EXAMINATION: Filed Vitals:   11/12/15 1237 11/12/15 1542 11/12/15 2050 11/13/15 0613  BP: 163/68 147/70 178/80 170/74  Pulse:  64 66 65  Temp:  98.1 F (36.7 C) 98.4 F (36.9 C) 98.4 F (36.9 C)  TempSrc:  Oral Oral Oral  Resp:  Height:      Weight:      SpO2:  97% 97% 98%   General appearance: alert, cooperative, appears stated age and no distress Resp: clear to auscultation bilaterally Cardio: regular rate and rhythm, S1, S2 normal, no murmur, click, rub or gallop GI: mildly tender in the right upper  quadrant without any rebound, rigidity or guarding. No Murphy's sign. Extremities: extremities normal, atraumatic, no cyanosis or edema   DISPOSITION: home  Discharge Instructions    Call MD for:  difficulty breathing, headache or visual disturbances    Complete by:  As directed      Call MD for:  extreme fatigue    Complete by:  As directed      Call MD for:  hives    Complete by:  As directed      Call MD for:  persistant dizziness or light-headedness    Complete by:  As directed      Call MD for:  persistant nausea and vomiting    Complete by:   As directed      Call MD for:  severe uncontrolled pain    Complete by:  As directed      Call MD for:  temperature >100.4    Complete by:  As directed      Diet - low sodium heart healthy    Complete by:  As directed      Discharge instructions    Complete by:  As directed   Please follow up with a PCP for further management of your hypertension. See Dr. Lovell SheehanJenkins as instructed.  You were cared for by a hospitalist during your hospital stay. If you have any questions about your discharge medications or the care you received while you were in the hospital after you are discharged, you can call the unit and asked to speak with the hospitalist on call if the hospitalist that took care of you is not available. Once you are discharged, your primary care physician will handle any further medical issues. Please note that NO REFILLS for any discharge medications will be authorized once you are discharged, as it is imperative that you return to your primary care physician (or establish a relationship with a primary care physician if you do not have one) for your aftercare needs so that they can reassess your need for medications and monitor your lab values. If you do not have a primary care physician, you can call [phone] for a physician referral.     Increase activity slowly    Complete by:  As directed            ALLERGIES:  Allergies  Allergen Reactions  . Penicillins Anaphylaxis     Discharge Medication List as of 11/13/2015 10:26 AM    START taking these medications   Details  amLODipine (NORVASC) 10 MG tablet Take 1 tablet (10 mg total) by mouth daily., Starting 11/13/2015, Until Discontinued, Print    ciprofloxacin (CIPRO) 500 MG tablet Take 1 tablet (500 mg total) by mouth 2 (two) times daily. For 7 more days., Starting 11/13/2015, Until Discontinued, Print    oxyCODONE (OXY IR/ROXICODONE) 5 MG immediate release tablet Take 1 tablet (5 mg total) by mouth every 4 (four) hours as needed  for moderate pain., Starting 11/13/2015, Until Discontinued, Print      CONTINUE these medications which have NOT CHANGED   Details  escitalopram (LEXAPRO) 10 MG tablet Take 10 mg by mouth daily., Until Discontinued, Historical Med    ibuprofen (ADVIL,MOTRIN) 200 MG tablet Take 200 mg by mouth every 6 (six) hours as needed for pain., Until Discontinued, Historical Med    triamcinolone (NASACORT) 55 MCG/ACT nasal inhaler Place 2 sprays into the nose daily., Until Discontinued, Historical Med      STOP taking these medications  acetaminophen (TYLENOL) 325 MG tablet      FLUoxetine (PROZAC) 20 MG capsule        Follow-up Information    Follow up with Dalia Heading, MD.   Specialty:  General Surgery   Why:  As needed.  To have surgery 11/19/15.     Contact information:   [address] [place] [address] [phone]       Follow up with ANDY,CAMILLE L, MD. Schedule an appointment as soon as possible for a visit in 1 week.   Specialty:  Family Medicine   Why:  BP check   Contact information:   [address] [place] [address] [phone]       TOTAL DISCHARGE TIME: 35 minutes  [facility]  [facility] Pager [phone]  11/13/2015, 11:29 AM

## 2015-11-13 NOTE — Discharge Instructions (Signed)
Nothing to eat or drink after midnight Thursday 11/18/15 for surgery on Friday.  Avoid fatty foods.   Hypertension Hypertension, commonly called high blood pressure, is when the force of blood pumping through your arteries is too strong. Your arteries are the blood vessels that carry blood from your heart throughout your body. A blood pressure reading consists of a higher number over a lower number, such as 110/72. The higher number (systolic) is the pressure inside your arteries when your heart pumps. The lower number (diastolic) is the pressure inside your arteries when your heart relaxes. Ideally you want your blood pressure below 120/80. Hypertension forces your heart to work harder to pump blood. Your arteries may become narrow or stiff. Having untreated or uncontrolled hypertension can cause heart attack, stroke, kidney disease, and other problems. RISK FACTORS Some risk factors for high blood pressure are controllable. Others are not.  Risk factors you cannot control include:   Race. You may be at higher risk if you are African American.  Age. Risk increases with age.  Gender. Men are at higher risk than women before age 59 years. After age 59, women are at higher risk than men. Risk factors you can control include:  Not getting enough exercise or physical activity.  Being overweight.  Getting too much fat, sugar, calories, or salt in your diet.  Drinking too much alcohol. SIGNS AND SYMPTOMS Hypertension does not usually cause signs or symptoms. Extremely high blood pressure (hypertensive crisis) may cause headache, anxiety, shortness of breath, and nosebleed. DIAGNOSIS To check if you have hypertension, your health care provider will measure your blood pressure while you are seated, with your arm held at the level of your heart. It should be measured at least twice using the same arm. Certain conditions can cause a difference in blood pressure between your right and left arms. A  blood pressure reading that is higher than normal on one occasion does not mean that you need treatment. If it is not clear whether you have high blood pressure, you may be asked to return on a different day to have your blood pressure checked again. Or, you may be asked to monitor your blood pressure at home for 1 or more weeks. TREATMENT Treating high blood pressure includes making lifestyle changes and possibly taking medicine. Living a healthy lifestyle can help lower high blood pressure. You may need to change some of your habits. Lifestyle changes may include:  Following the DASH diet. This diet is high in fruits, vegetables, and whole grains. It is low in salt, red meat, and added sugars.  Keep your sodium intake below 2,300 mg per day.  Getting at least 30-45 minutes of aerobic exercise at least 4 times per week.  Losing weight if necessary.  Not smoking.  Limiting alcoholic beverages.  Learning ways to reduce stress. Your health care provider may prescribe medicine if lifestyle changes are not enough to get your blood pressure under control, and if one of the following is true:  You are 4418-59 years of age and your systolic blood pressure is above 140.  You are 59 years of age or older, and your systolic blood pressure is above 150.  Your diastolic blood pressure is above 90.  You have diabetes, and your systolic blood pressure is over 140 or your diastolic blood pressure is over 90.  You have kidney disease and your blood pressure is above 140/90.  You have heart disease and your blood pressure is above 140/90. Your  personal target blood pressure may vary depending on your medical conditions, your age, and other factors. HOME CARE INSTRUCTIONS  Have your blood pressure rechecked as directed by your health care provider.   Take medicines only as directed by your health care provider. Follow the directions carefully. Blood pressure medicines must be taken as prescribed.  The medicine does not work as well when you skip doses. Skipping doses also puts you at risk for problems.  Do not smoke.   Monitor your blood pressure at home as directed by your health care provider. SEEK MEDICAL CARE IF:   You think you are having a reaction to medicines taken.  You have recurrent headaches or feel dizzy.  You have swelling in your ankles.  You have trouble with your vision. SEEK IMMEDIATE MEDICAL CARE IF:  You develop a severe headache or confusion.  You have unusual weakness, numbness, or feel faint.  You have severe chest or abdominal pain.  You vomit repeatedly.  You have trouble breathing. MAKE SURE YOU:   Understand these instructions.  Will watch your condition.  Will get help right away if you are not doing well or get worse.   This information is not intended to replace advice given to you by your health care provider. Make sure you discuss any questions you have with your health care provider.   Document Released: 11/27/2005 Document Revised: 04/13/2015 Document Reviewed: 09/19/2013 Elsevier Interactive Patient Education [employer]

## 2015-11-15 NOTE — H&P (Signed)
Erin BaarsJanice N Velez is an 59 y.o. female.   Chief Complaint: Cholecystitis, cholelithiasis HPI: Patient is a 59 year old morbidly obese white female who presented to the hospital with worsening right upper quadrant abdominal pain and nausea. She has been have cholecystitis with cholelithiasis. She was recently discharged from the hospital after her tests to normalize now presents for elective laparoscopic cholecystectomy with cholangiograms.  Past Medical History  Diagnosis Date  . Hypertension   . Asthma   . Depression     Past Surgical History  Procedure Laterality Date  . Replacement total knee Left 2008    Family History  Problem Relation Age of Onset  . Diabetes Maternal Aunt   . Diabetes Other   . Heart disease Other    Social History:  reports that she has quit smoking. She quit smokeless tobacco use about 7 years ago. She reports that she does not drink alcohol or use illicit drugs.  Allergies:  Allergies  Allergen Reactions  . Penicillins Anaphylaxis    No prescriptions prior to admission    No results found for this or any previous visit (from the past 48 hour(s)). No results found.  Review of Systems  Constitutional: Positive for malaise/fatigue.  HENT: Negative.   Eyes: Negative.   Respiratory: Negative.   Cardiovascular: Negative.   Gastrointestinal: Positive for nausea and abdominal pain.  Genitourinary: Negative.   Musculoskeletal: Negative.   Skin: Negative.   Psychiatric/Behavioral: Positive for depression.  All other systems reviewed and are negative.   There were no vitals taken for this visit. Physical Exam  Constitutional: She is oriented to person, place, and time. She appears well-developed and well-nourished.  HENT:  Head: Normocephalic and atraumatic.  Eyes: No scleral icterus.  Neck: Normal range of motion. Neck supple.  Cardiovascular: Normal rate, regular rhythm and normal heart sounds.   Respiratory: Effort normal and breath sounds  normal.  GI: Soft. She exhibits no distension. There is tenderness. There is no rebound.  Musculoskeletal: Normal range of motion.  Neurological: She is alert and oriented to person, place, and time.  Skin: Skin is warm and dry.     Assessment/Plan Impression: Cholecystitis, cholelithiasis Plan: Patient will undergo a laparoscopic cholecystectomy with cholangiograms on 11/19/2015. The risks and benefits of the procedure including bleeding, infection, hepatobiliary injury, the possibility of an open procedure were fully explained to the patient, who gave informed consent.  Deamonte Sayegh A 11/15/2015, 9:57 AM

## 2015-11-16 ENCOUNTER — Encounter (HOSPITAL_COMMUNITY)
Admission: RE | Admit: 2015-11-16 | Discharge: 2015-11-16 | Disposition: A | Discharge status: Home / Self Care | Payer: Managed Care, Other (non HMO) | Source: Ambulatory Visit | Attending: General Surgery | Admitting: General Surgery

## 2015-11-19 ENCOUNTER — Encounter (HOSPITAL_COMMUNITY): Payer: Self-pay | Admitting: *Deleted

## 2015-11-19 ENCOUNTER — Encounter (HOSPITAL_COMMUNITY)
Admission: RE | Disposition: A | Discharge status: Home / Self Care | Payer: Self-pay | Source: Ambulatory Visit | Attending: General Surgery

## 2015-11-19 ENCOUNTER — Ambulatory Visit (HOSPITAL_COMMUNITY)
Admission: RE | Admit: 2015-11-19 | Discharge: 2015-11-19 | Disposition: A | Discharge status: Home / Self Care | Payer: Managed Care, Other (non HMO) | Source: Ambulatory Visit | Attending: General Surgery | Admitting: General Surgery

## 2015-11-19 ENCOUNTER — Ambulatory Visit (HOSPITAL_COMMUNITY): Payer: Managed Care, Other (non HMO)

## 2015-11-19 ENCOUNTER — Ambulatory Visit (HOSPITAL_COMMUNITY): Payer: Managed Care, Other (non HMO) | Admitting: Certified Registered Nurse Anesthetist

## 2015-11-19 DIAGNOSIS — F329 Major depressive disorder, single episode, unspecified: Secondary | ICD-10-CM | POA: Diagnosis not present

## 2015-11-19 DIAGNOSIS — Z87891 Personal history of nicotine dependence: Secondary | ICD-10-CM | POA: Insufficient documentation

## 2015-11-19 DIAGNOSIS — Z880 Allergy status to penicillin: Secondary | ICD-10-CM | POA: Insufficient documentation

## 2015-11-19 DIAGNOSIS — I10 Essential (primary) hypertension: Secondary | ICD-10-CM | POA: Insufficient documentation

## 2015-11-19 DIAGNOSIS — J45909 Unspecified asthma, uncomplicated: Secondary | ICD-10-CM | POA: Insufficient documentation

## 2015-11-19 DIAGNOSIS — F901 Attention-deficit hyperactivity disorder, predominantly hyperactive type: Secondary | ICD-10-CM | POA: Insufficient documentation

## 2015-11-19 DIAGNOSIS — K8010 Calculus of gallbladder with chronic cholecystitis without obstruction: Secondary | ICD-10-CM | POA: Insufficient documentation

## 2015-11-19 DIAGNOSIS — Z6841 Body Mass Index (BMI) 40.0 and over, adult: Secondary | ICD-10-CM | POA: Insufficient documentation

## 2015-11-19 HISTORY — PX: CHOLECYSTECTOMY: SHX55

## 2015-11-19 SURGERY — LAPAROSCOPIC CHOLECYSTECTOMY
Anesthesia: General

## 2015-11-19 MED ORDER — LIDOCAINE HCL (PF) 1 % IJ SOLN
INTRAMUSCULAR | Status: AC
  Filled 2015-11-19: qty 5

## 2015-11-19 MED ORDER — NEOSTIGMINE METHYLSULFATE 10 MG/10ML IV SOLN
INTRAVENOUS | Status: DC | PRN
  Administered 2015-11-19: 4 mg via INTRAVENOUS

## 2015-11-19 MED ORDER — FENTANYL CITRATE (PF) 100 MCG/2ML IJ SOLN
25.0000 ug | INTRAMUSCULAR | Status: DC | PRN
  Administered 2015-11-19 (×4): 50 ug via INTRAVENOUS
  Filled 2015-11-19: qty 2

## 2015-11-19 MED ORDER — ONDANSETRON HCL 4 MG/2ML IJ SOLN
4.0000 mg | Freq: Once | INTRAMUSCULAR | Status: AC | PRN
  Administered 2015-11-19: 4 mg via INTRAVENOUS

## 2015-11-19 MED ORDER — PROPOFOL 10 MG/ML IV BOLUS
INTRAVENOUS | Status: DC | PRN
  Administered 2015-11-19: 150 mg via INTRAVENOUS

## 2015-11-19 MED ORDER — ONDANSETRON HCL 4 MG/2ML IJ SOLN
4.0000 mg | Freq: Once | INTRAMUSCULAR | Status: AC
  Administered 2015-11-19: 4 mg via INTRAVENOUS

## 2015-11-19 MED ORDER — NEOSTIGMINE METHYLSULFATE 10 MG/10ML IV SOLN
INTRAVENOUS | Status: AC
  Filled 2015-11-19: qty 1

## 2015-11-19 MED ORDER — FENTANYL CITRATE (PF) 250 MCG/5ML IJ SOLN
INTRAMUSCULAR | Status: AC
  Filled 2015-11-19: qty 5

## 2015-11-19 MED ORDER — KETOROLAC TROMETHAMINE 30 MG/ML IJ SOLN
INTRAMUSCULAR | Status: AC
  Filled 2015-11-19: qty 1

## 2015-11-19 MED ORDER — KETOROLAC TROMETHAMINE 30 MG/ML IJ SOLN
30.0000 mg | Freq: Once | INTRAMUSCULAR | Status: AC
  Administered 2015-11-19: 30 mg via INTRAVENOUS

## 2015-11-19 MED ORDER — CIPROFLOXACIN IN D5W 400 MG/200ML IV SOLN
INTRAVENOUS | Status: AC
  Filled 2015-11-19: qty 200

## 2015-11-19 MED ORDER — ONDANSETRON HCL 4 MG/2ML IJ SOLN
INTRAMUSCULAR | Status: AC
  Filled 2015-11-19: qty 2

## 2015-11-19 MED ORDER — POVIDONE-IODINE 10 % EX OINT
TOPICAL_OINTMENT | CUTANEOUS | Status: AC
  Filled 2015-11-19: qty 1

## 2015-11-19 MED ORDER — GLYCOPYRROLATE 0.2 MG/ML IJ SOLN
0.2000 mg | Freq: Once | INTRAMUSCULAR | Status: AC
  Administered 2015-11-19: 0.2 mg via INTRAVENOUS

## 2015-11-19 MED ORDER — PROPOFOL 10 MG/ML IV BOLUS
INTRAVENOUS | Status: AC
  Filled 2015-11-19: qty 20

## 2015-11-19 MED ORDER — GLYCOPYRROLATE 0.2 MG/ML IJ SOLN
INTRAMUSCULAR | Status: AC
  Filled 2015-11-19: qty 3

## 2015-11-19 MED ORDER — 0.9 % SODIUM CHLORIDE (POUR BTL) OPTIME
TOPICAL | Status: DC | PRN
  Administered 2015-11-19: 1000 mL

## 2015-11-19 MED ORDER — ROCURONIUM BROMIDE 100 MG/10ML IV SOLN
INTRAVENOUS | Status: DC | PRN
  Administered 2015-11-19: 15 mg via INTRAVENOUS
  Administered 2015-11-19: 10 mg via INTRAVENOUS

## 2015-11-19 MED ORDER — ROCURONIUM BROMIDE 50 MG/5ML IV SOLN
INTRAVENOUS | Status: AC
  Filled 2015-11-19: qty 1

## 2015-11-19 MED ORDER — SUCCINYLCHOLINE CHLORIDE 20 MG/ML IJ SOLN
INTRAMUSCULAR | Status: AC
  Filled 2015-11-19: qty 2

## 2015-11-19 MED ORDER — MIDAZOLAM HCL 2 MG/2ML IJ SOLN
1.0000 mg | INTRAMUSCULAR | Status: DC | PRN
  Administered 2015-11-19: 2 mg via INTRAVENOUS

## 2015-11-19 MED ORDER — VECURONIUM BROMIDE 10 MG IV SOLR
INTRAVENOUS | Status: AC
  Filled 2015-11-19: qty 10

## 2015-11-19 MED ORDER — GLYCOPYRROLATE 0.2 MG/ML IJ SOLN
INTRAMUSCULAR | Status: DC | PRN
  Administered 2015-11-19: 0.6 mg via INTRAVENOUS

## 2015-11-19 MED ORDER — ARTIFICIAL TEARS OP OINT
TOPICAL_OINTMENT | OPHTHALMIC | Status: AC
  Filled 2015-11-19: qty 7

## 2015-11-19 MED ORDER — DEXAMETHASONE SODIUM PHOSPHATE 4 MG/ML IJ SOLN
INTRAMUSCULAR | Status: AC
  Filled 2015-11-19: qty 1

## 2015-11-19 MED ORDER — BUPIVACAINE HCL 0.5 % IJ SOLN
INTRAMUSCULAR | Status: DC | PRN
  Administered 2015-11-19: 10 mL

## 2015-11-19 MED ORDER — SODIUM CHLORIDE 0.9 % IJ SOLN
INTRAMUSCULAR | Status: AC
  Filled 2015-11-19: qty 10

## 2015-11-19 MED ORDER — LACTATED RINGERS IV SOLN
INTRAVENOUS | Status: DC
  Administered 2015-11-19 (×2): via INTRAVENOUS

## 2015-11-19 MED ORDER — OXYCODONE HCL 5 MG PO TABS: 5.0000 mg | ORAL_TABLET | ORAL | Status: AC | PRN

## 2015-11-19 MED ORDER — ROCURONIUM BROMIDE 50 MG/5ML IV SOLN
INTRAVENOUS | Status: AC
  Filled 2015-11-19: qty 2

## 2015-11-19 MED ORDER — FENTANYL CITRATE (PF) 100 MCG/2ML IJ SOLN
INTRAMUSCULAR | Status: DC | PRN
  Administered 2015-11-19: 50 ug via INTRAVENOUS
  Administered 2015-11-19: 100 ug via INTRAVENOUS
  Administered 2015-11-19 (×2): 50 ug via INTRAVENOUS

## 2015-11-19 MED ORDER — POVIDONE-IODINE 10 % OINT PACKET
TOPICAL_OINTMENT | CUTANEOUS | Status: DC | PRN
  Administered 2015-11-19: 1 via TOPICAL

## 2015-11-19 MED ORDER — LIDOCAINE HCL (CARDIAC) 20 MG/ML IV SOLN
INTRAVENOUS | Status: DC | PRN
  Administered 2015-11-19: 50 mg via INTRAVENOUS

## 2015-11-19 MED ORDER — BUPIVACAINE HCL (PF) 0.5 % IJ SOLN
INTRAMUSCULAR | Status: AC
  Filled 2015-11-19: qty 30

## 2015-11-19 MED ORDER — CIPROFLOXACIN IN D5W 400 MG/200ML IV SOLN
400.0000 mg | INTRAVENOUS | Status: AC
  Administered 2015-11-19: 400 mg via INTRAVENOUS

## 2015-11-19 MED ORDER — GLYCOPYRROLATE 0.2 MG/ML IJ SOLN
INTRAMUSCULAR | Status: AC
  Filled 2015-11-19: qty 1

## 2015-11-19 MED ORDER — MIDAZOLAM HCL 2 MG/2ML IJ SOLN
INTRAMUSCULAR | Status: AC
  Filled 2015-11-19: qty 2

## 2015-11-19 MED ORDER — SUCCINYLCHOLINE CHLORIDE 20 MG/ML IJ SOLN
INTRAMUSCULAR | Status: DC | PRN
  Administered 2015-11-19: 140 mg via INTRAVENOUS

## 2015-11-19 MED ORDER — ARTIFICIAL TEARS OP OINT
TOPICAL_OINTMENT | OPHTHALMIC | Status: DC | PRN
  Administered 2015-11-19: 1 via OPHTHALMIC

## 2015-11-19 MED ORDER — DEXAMETHASONE SODIUM PHOSPHATE 4 MG/ML IJ SOLN
4.0000 mg | Freq: Once | INTRAMUSCULAR | Status: AC
  Administered 2015-11-19: 4 mg via INTRAVENOUS

## 2015-11-19 MED ORDER — FENTANYL CITRATE (PF) 100 MCG/2ML IJ SOLN
INTRAMUSCULAR | Status: AC
  Filled 2015-11-19: qty 2

## 2015-11-19 SURGICAL SUPPLY — 46 items
APPLIER CLIP LAPSCP 10X32 DD (CLIP) ×3 IMPLANT
BAG HAMPER (MISCELLANEOUS) ×3 IMPLANT
CHLORAPREP W/TINT 26ML (MISCELLANEOUS) ×3 IMPLANT
CLOTH BEACON ORANGE TIMEOUT ST (SAFETY) ×3 IMPLANT
COVER LIGHT HANDLE STERIS (MISCELLANEOUS) ×6 IMPLANT
COVER MAYO STAND XLG (DRAPE) ×3 IMPLANT
DECANTER SPIKE VIAL GLASS SM (MISCELLANEOUS) ×3 IMPLANT
DRAPE C-ARM FOLDED MOBILE STRL (DRAPES) ×3 IMPLANT
ELECT REM PT RETURN 9FT ADLT (ELECTROSURGICAL) ×3
ELECTRODE REM PT RTRN 9FT ADLT (ELECTROSURGICAL) ×1 IMPLANT
FILTER SMOKE EVAC LAPAROSHD (FILTER) ×3 IMPLANT
FORMALIN 10 PREFIL 120ML (MISCELLANEOUS) ×3 IMPLANT
GLOVE BIO SURGEON STRL SZ7 (GLOVE) ×6 IMPLANT
GLOVE BIOGEL PI IND STRL 7.0 (GLOVE) ×3 IMPLANT
GLOVE BIOGEL PI IND STRL 8 (GLOVE) ×1 IMPLANT
GLOVE BIOGEL PI INDICATOR 7.0 (GLOVE) ×6
GLOVE BIOGEL PI INDICATOR 8 (GLOVE) ×2
GLOVE ECLIPSE 8.0 STRL XLNG CF (GLOVE) ×3 IMPLANT
GLOVE SURG SS PI 7.5 STRL IVOR (GLOVE) ×6 IMPLANT
GOWN STRL REUS W/ TWL LRG LVL3 (GOWN DISPOSABLE) ×3 IMPLANT
GOWN STRL REUS W/ TWL XL LVL3 (GOWN DISPOSABLE) ×2 IMPLANT
GOWN STRL REUS W/TWL LRG LVL3 (GOWN DISPOSABLE) ×6
GOWN STRL REUS W/TWL XL LVL3 (GOWN DISPOSABLE) ×4
HEMOSTAT SNOW SURGICEL 2X4 (HEMOSTASIS) ×3 IMPLANT
INST SET LAPROSCOPIC AP (KITS) ×3 IMPLANT
KIT ROOM TURNOVER APOR (KITS) ×3 IMPLANT
MANIFOLD NEPTUNE II (INSTRUMENTS) ×3 IMPLANT
NEEDLE INSUFFLATION 120MM (ENDOMECHANICALS) ×3 IMPLANT
NS IRRIG 1000ML POUR BTL (IV SOLUTION) ×3 IMPLANT
PACK LAP CHOLE LZT030E (CUSTOM PROCEDURE TRAY) ×3 IMPLANT
PAD ARMBOARD 7.5X6 YLW CONV (MISCELLANEOUS) ×3 IMPLANT
POUCH SPECIMEN RETRIEVAL 10MM (ENDOMECHANICALS) ×3 IMPLANT
SET BASIN LINEN APH (SET/KITS/TRAYS/PACK) ×3 IMPLANT
SLEEVE ENDOPATH XCEL 5M (ENDOMECHANICALS) ×3 IMPLANT
SPONGE GAUZE 2X2 8PLY STER LF (GAUZE/BANDAGES/DRESSINGS) ×4
SPONGE GAUZE 2X2 8PLY STRL LF (GAUZE/BANDAGES/DRESSINGS) ×8 IMPLANT
STAPLER VISISTAT (STAPLE) ×3 IMPLANT
SUT VICRYL 0 UR6 27IN ABS (SUTURE) ×3 IMPLANT
SYR CONTROL 10ML LL (SYRINGE) ×3 IMPLANT
TAPE CLOTH SURG 4X10 WHT LF (GAUZE/BANDAGES/DRESSINGS) ×3 IMPLANT
TROCAR ENDO BLADELESS 11MM (ENDOMECHANICALS) ×3 IMPLANT
TROCAR XCEL NON-BLD 5MMX100MML (ENDOMECHANICALS) ×3 IMPLANT
TROCAR XCEL UNIV SLVE 11M 100M (ENDOMECHANICALS) ×3 IMPLANT
TUBING INSUFFLATION (TUBING) ×3 IMPLANT
WARMER LAPAROSCOPE (MISCELLANEOUS) ×3 IMPLANT
YANKAUER SUCT 12FT TUBE ARGYLE (SUCTIONS) ×3 IMPLANT

## 2015-11-19 NOTE — Anesthesia Procedure Notes (Signed)
Procedure Name: Intubation Date/Time: 11/19/2015 10:17 AM Performed by: Pernell DupreADAMS, Tiffanni Scarfo A Pre-anesthesia Checklist: Patient identified, Patient being monitored, Timeout performed, Emergency Drugs available and Suction available Patient Re-evaluated:Patient Re-evaluated prior to inductionOxygen Delivery Method: Circle System Utilized Preoxygenation: Pre-oxygenation with 100% oxygen Intubation Type: IV induction, Rapid sequence and Cricoid Pressure applied Ventilation: Mask ventilation without difficulty Laryngoscope Size: 3 and Glidescope Grade View: Grade I Tube type: Oral Tube size: 7.0 mm Number of attempts: 1 Airway Equipment and Method: Stylet and Video-laryngoscopy Placement Confirmation: ETT inserted through vocal cords under direct vision,  positive ETCO2 and breath sounds checked- equal and bilateral Secured at: 21 cm Tube secured with: Tape Dental Injury: Teeth and Oropharynx as per pre-operative assessment

## 2015-11-19 NOTE — Anesthesia Preprocedure Evaluation (Signed)
Anesthesia Evaluation  Patient identified by MRN, date of birth, ID band Patient awake    History of Anesthesia Complications (+) DIFFICULT AIRWAY and history of anesthetic complications  Airway Mallampati: III  TM Distance: <3 FB   Mouth opening: Limited Mouth Opening  Dental  (+) Teeth Intact, Dental Advisory Given   Pulmonary asthma , former smoker,    breath sounds clear to auscultation       Cardiovascular hypertension, Pt. on medications  Rhythm:Regular Rate:Normal     Neuro/Psych PSYCHIATRIC DISORDERS Depression ADHD     GI/Hepatic negative GI ROS, Elevated liver function tests   Endo/Other    Renal/GU      Musculoskeletal   Abdominal   Peds  Hematology   Anesthesia Other Findings Pt was told she had a difficult airway after her last surgery.  Reproductive/Obstetrics                             Anesthesia Physical Anesthesia Plan  ASA: III  Anesthesia Plan: General   Post-op Pain Management:    Induction: Intravenous and Rapid sequence  Airway Management Planned: Video Laryngoscope Planned and Oral ETT  Additional Equipment:   Intra-op Plan:   Post-operative Plan: Extubation in OR  Informed Consent: I have reviewed the patients History and Physical, chart, labs and discussed the procedure including the risks, benefits and alternatives for the proposed anesthesia with the patient or authorized representative who has indicated his/her understanding and acceptance.     Plan Discussed with:   Anesthesia Plan Comments:         Anesthesia Quick Evaluation

## 2015-11-19 NOTE — Discharge Instructions (Signed)
Laparoscopic Cholecystectomy, Care After °Refer to this sheet in the next few weeks. These instructions provide you with information about caring for yourself after your procedure. Your health care provider may also give you more specific instructions. Your treatment has been planned according to current medical practices, but problems sometimes occur. Call your health care provider if you have any problems or questions after your procedure. °WHAT TO EXPECT AFTER THE PROCEDURE °After your procedure, it is common to have: °· Pain at your incision sites. You will be given pain medicines to control your pain. °· Mild nausea or vomiting. This should improve after the first 24 hours. °· Bloating and possible shoulder pain from the gas that was used during the procedure. This will improve after the first 24 hours. °HOME CARE INSTRUCTIONS °Incision Care °· Follow instructions from your health care provider about how to take care of your incisions. Make sure you: °¨ Wash your hands with soap and water before you change your bandage (dressing). If soap and water are not available, use hand sanitizer. °¨ Change your dressing as told by your health care provider. °¨ Leave stitches (sutures), skin glue, or adhesive strips in place. These skin closures may need to be in place for 2 weeks or longer. If adhesive strip edges start to loosen and curl up, you may trim the loose edges. Do not remove adhesive strips completely unless your health care provider tells you to do that. °· Do not take baths, swim, or use a hot tub until your health care provider approves. Ask your health care provider if you can take showers. You may only be allowed to take sponge baths for bathing. °General Instructions °· Take over-the-counter and prescription medicines only as told by your health care provider. °· Do not drive or operate heavy machinery while taking prescription pain medicine. °· Return to your normal diet as told by your health care  provider. °· Do not lift anything that is heavier than 10 lb (4.5 kg). °· Do not play contact sports for one week or until your health care provider approves. °SEEK MEDICAL CARE IF:  °· You have redness, swelling, or pain at the site of your incision. °· You have fluid, blood, or pus coming from your incision. °· You notice a bad smell coming from your incision area. °· Your surgical incisions break open. °· You have a fever. °SEEK IMMEDIATE MEDICAL CARE IF: °· You develop a rash. °· You have difficulty breathing. °· You have chest pain. °· You have increasing pain in your shoulders (shoulder strap areas). °· You faint or have dizzy episodes while you are standing. °· You have severe pain in your abdomen. °· You have nausea or vomiting that lasts for more than one day. °  °This information is not intended to replace advice given to you by your health care provider. Make sure you discuss any questions you have with your health care provider. °  °Document Released: 11/27/2005 Document Revised: 08/18/2015 Document Reviewed: 07/09/2013 °Elsevier Interactive Patient Education ©2016 [employer] Inc[employer] ° °

## 2015-11-19 NOTE — Op Note (Signed)
Patient:  Erin BaarsJanice N Velez  DOB:  [birth year]  MRN:  [id number]   Preop Diagnosis:  Cholecystitis, cholelithiasis  Postop Diagnosis:  Same  Procedure:  Laparoscopic cholecystectomy  Surgeon:  Franky MachoMark Erynne Kealey, M.D.  Anes:  Gen. endotracheal  Indications:  Patient is a 59 year old white female who presents with cholecystitis secondary to cholelithiasis. The risks and benefits of the procedure including bleeding, infection, hepatobiliary injury, and the possibility of an open procedure were fully explained to the patient, who gave informed consent.  Procedure note:  The patient was placed the supine position. After induction of general endotracheal anesthesia, the abdomen was prepped and draped using usual sterile technique with DuraPrep. Surgical site confirmation was performed.  A supraumbilical incision was made down to the fascia. A Veress needle was introduced into the abdominal cavity and confirmation of placement was done using the saline drop test. The abdomen was then insufflated to 16 mmHg pressure. An 11 mm trocar was introduced into the abdominal cavity under direct visualization without difficulty. The patient was placed in reverse Trendelenburg position and an additional 11 mm trocar was placed the epigastric region and 5 mm trochars were placed the right upper quadrant and right flank regions. Liver was inspected and noted within normal limits. The gallbladder was retracted in a dynamic fashion in order to give a critical view of the triangle of Calot. The cystic duct was first identified. Its junction to the infundibulum was fully identified. It was noted be very narrow, including a cholangiogram. Endoclips were placed proximally distally on the cystic duct, and the cystic duct was divided. This was likewise done the cystic artery. The gallbladder was freed away from the gallbladder fossa using Bovie electrocautery. The gallbladder was delivered to the epigastric trocar site using an Endo  Catch bag. The gallbladder fossa was inspected and no abnormal bleeding or bile leakage was noted. Surgicel was placed the gallbladder fossa. All fluid and air were then evacuated from the abdominal cavity prior to removal of the trochars.  All wounds were irrigated with normal saline. All wounds were injected with 0.5% Sensorcaine. The supraumbilical fascia as well as epigastric fascia were reapproximated using 0 Vicryl interrupted sutures. All skin incisions were closed using staples. Betadine ointment and dry sterile dressings were applied.  All tape and needle counts were correct at the end of the procedure. Patient was extubated in the operating room and transferred to PACU in stable condition.  Complications:  None  EBL:  Minimal  Specimen:  Gallbladder

## 2015-11-19 NOTE — Transfer of Care (Signed)
Immediate Anesthesia Transfer of Care Note  Patient: [name]  Procedure(s) Performed: Procedure(s): LAPAROSCOPIC CHOLECYSTECTOMY (N/A)  Patient Location: PACU  Anesthesia Type:General  Level of Consciousness: awake, alert , oriented and patient cooperative  Airway & Oxygen Therapy: Patient Spontanous Breathing and Patient connected to face mask oxygen  Post-op Assessment: Report given to RN and Post -op Vital signs reviewed and stable  Post vital signs: Reviewed and stable  Last Vitals:  Filed Vitals:   11/19/15 0927 11/19/15 1000  BP:  111/54  Pulse: 73   Temp: 36.8 C   Resp: 24 17    Complications: No apparent anesthesia complications

## 2015-11-19 NOTE — Interval H&P Note (Signed)
History and Physical Interval Note:  11/19/2015 9:59 AM  Erin BaarsJanice N Bhakta  has presented today for surgery, with the diagnosis of cholecystitis, cholelithiasis  The various methods of treatment have been discussed with the patient and family. After consideration of risks, benefits and other options for treatment, the patient has consented to  Procedure(s): LAPAROSCOPIC CHOLECYSTECTOMY WITH INTRAOPERATIVE CHOLANGIOGRAM (N/A) as a surgical intervention .  The patient's history has been reviewed, patient examined, no change in status, stable for surgery.  I have reviewed the patient's chart and labs.  Questions were answered to the patient's satisfaction.     Franky MachoJENKINS,Butler Vegh A

## 2015-11-19 NOTE — Anesthesia Postprocedure Evaluation (Signed)
Anesthesia Post Note  Patient: [name]  Procedure(s) Performed: Procedure(s) (LRB): LAPAROSCOPIC CHOLECYSTECTOMY (N/A)  Patient location during evaluation: PACU Anesthesia Type: General Level of consciousness: awake and alert and oriented Pain management: pain level controlled Vital Signs Assessment: post-procedure vital signs reviewed and stable Respiratory status: spontaneous breathing and respiratory function stable Cardiovascular status: blood pressure returned to baseline and stable Anesthetic complications: no    Last Vitals:  Filed Vitals:   11/19/15 0927 11/19/15 1000  BP:  111/54  Pulse: 73   Temp: 36.8 C   Resp: 24 17    Last Pain: There were no vitals filed for this visit.               Rileigh Kawashima A

## 2015-11-22 ENCOUNTER — Encounter (HOSPITAL_COMMUNITY): Payer: Self-pay | Admitting: General Surgery

## 2016-10-20 IMAGING — CT CT ABD-PELV W/O CM
2 of 4 series · 17 of 46 positions shown, 19 images · non-contrast
Comparison: None.

CLINICAL DATA: Sharp lower back pain

EXAM:
CT ABDOMEN AND PELVIS WITHOUT CONTRAST
TECHNIQUE: Multidetector CT imaging of the abdomen and pelvis was performed
following the standard protocol without IV contrast.

[Series 2: standard/full over (age)lbs 5.0 · axial · 0.88mm/px · z∈[-360,+66]mm · 14 of 95 slices shown, 16 images]
[im 5/95  soft-tissue]
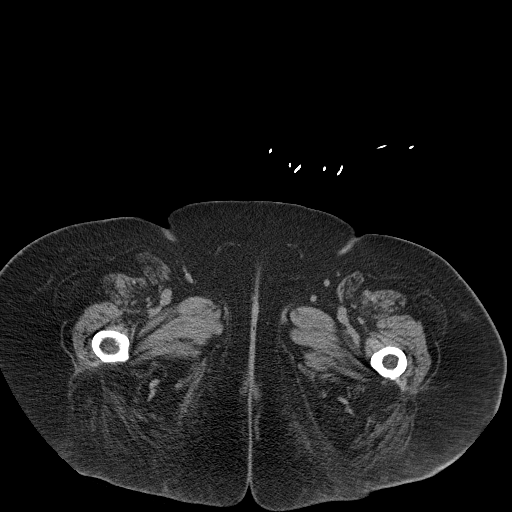
[im 5/95  bone]
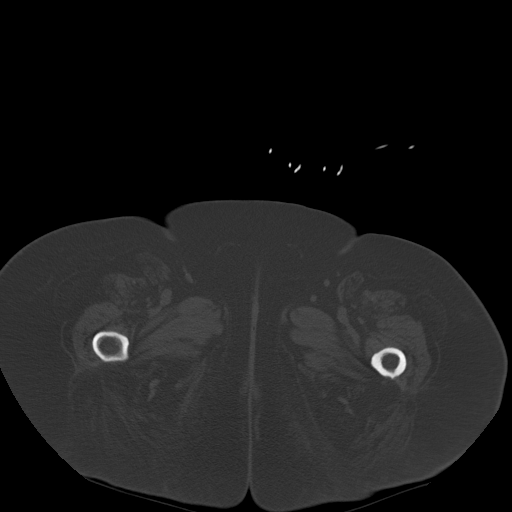
[im 13/95  soft-tissue]
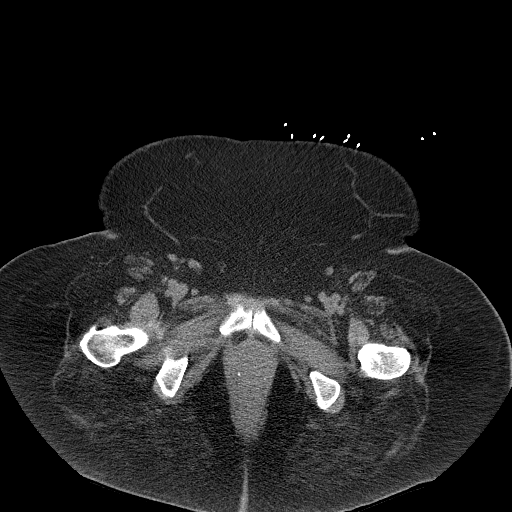
[im 17/95  soft-tissue]
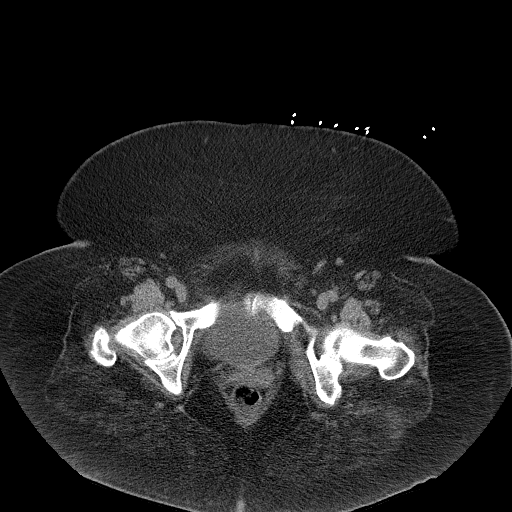
[im 25/95  soft-tissue]
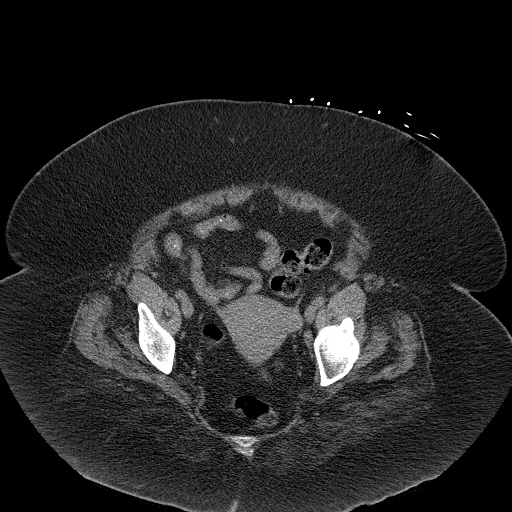
[im 33/95  soft-tissue]
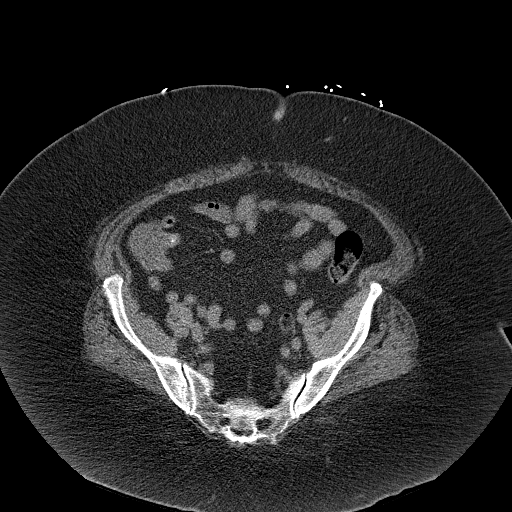
[im 37/95  soft-tissue]
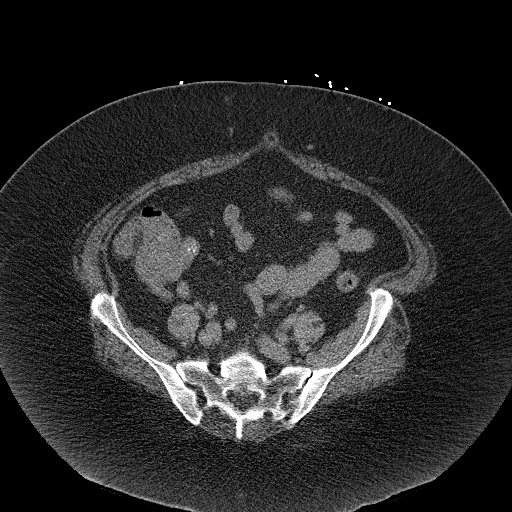
[im 45/95  soft-tissue]
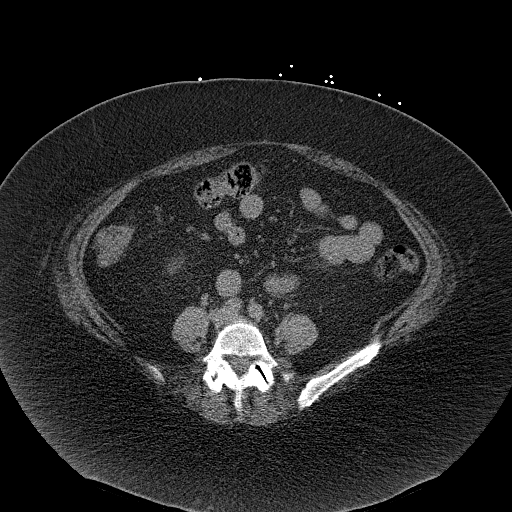
[im 50/95  soft-tissue]
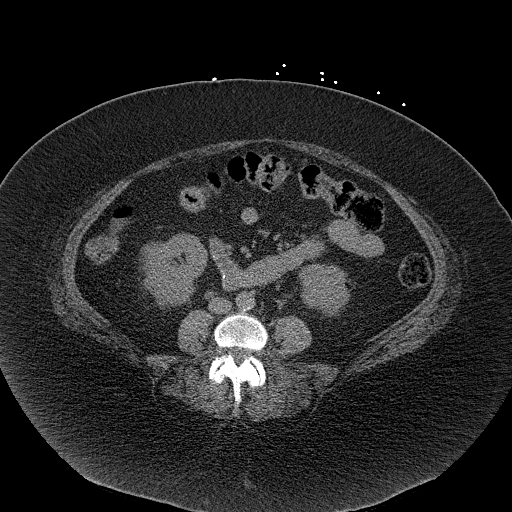
[im 58/95  soft-tissue]
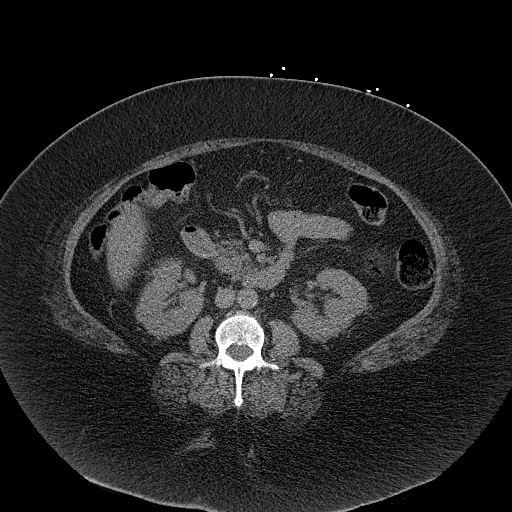
[im 58/95  bone]
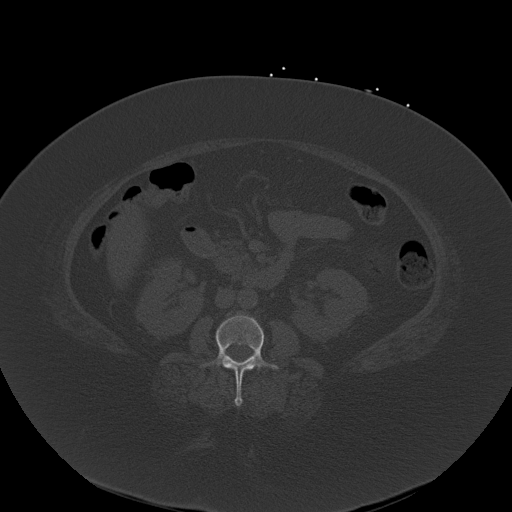
[im 62/95  soft-tissue]
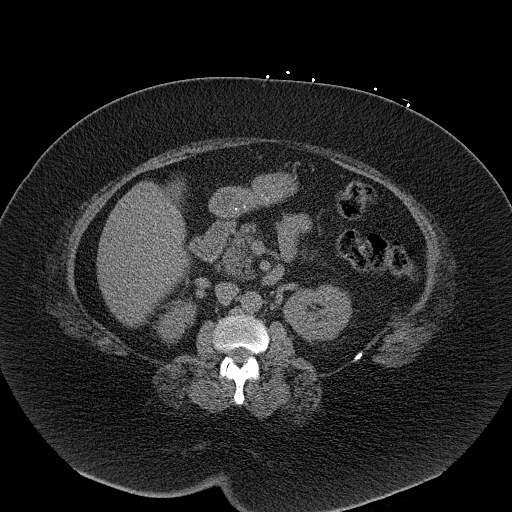
[im 70/95  soft-tissue]
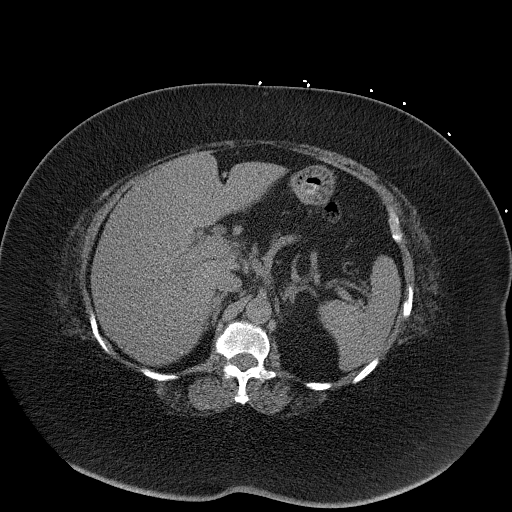
[im 78/95  soft-tissue]
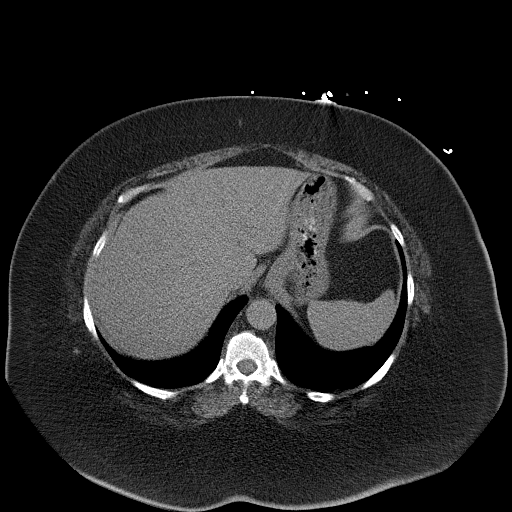
[im 82/95  soft-tissue]
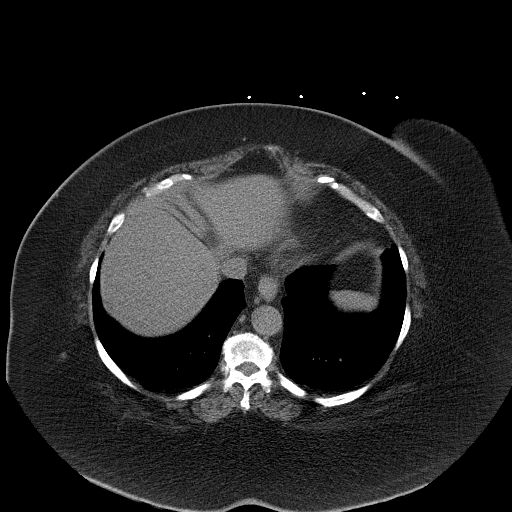
[im 90/95  soft-tissue]
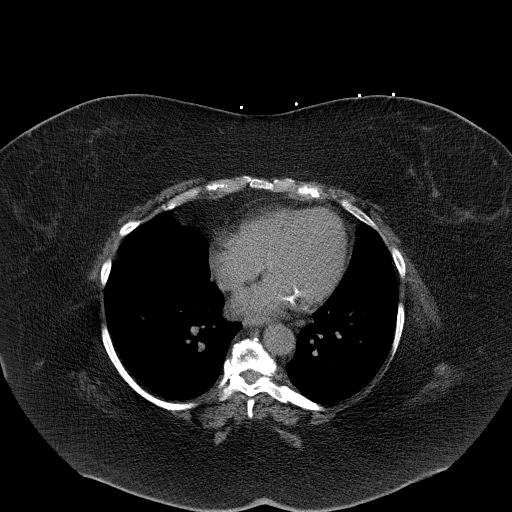

[Series 3: mpr coronal · coronal · 0.93mm/px · 3 of 128 slices shown]
[im 43/128  soft-tissue]
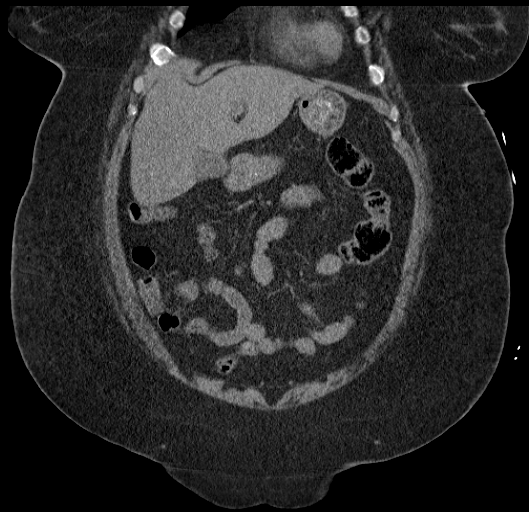
[im 57/128  soft-tissue]
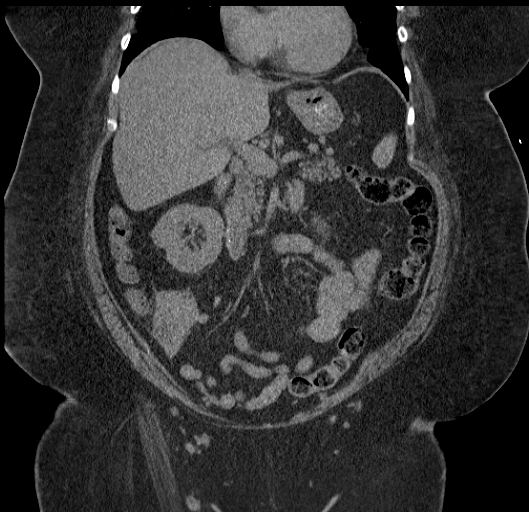
[im 71/128  soft-tissue]
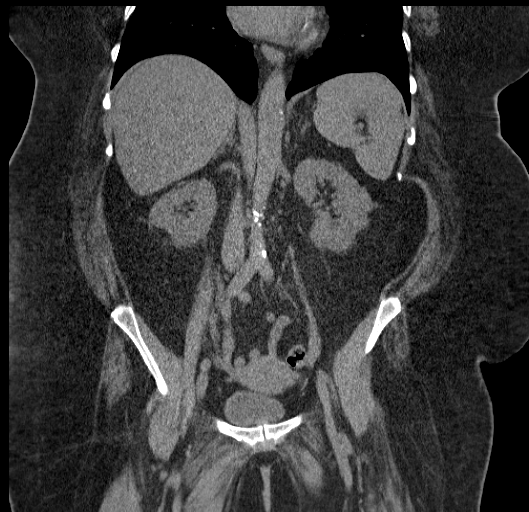

[17 of 46 positions shown; findings below may reference images not displayed]

FINDINGS: There is mild periureteral stranding on the right, possibly with a 3
mm calculus within the urinary bladder lumen. No conclusive
obstructing ureteral calculus is evident at this time. No collecting
system calculi.

There are unremarkable unenhanced appearances of the liver,
gallbladder, pancreas, spleen, adrenals and left kidney. Bowel is
unremarkable. Uterus and ovaries are unremarkable. No focal
inflammatory changes are evident in the abdomen or pelvis. The
abdominal aorta is normal in caliber with mild atherosclerotic
calcification.

There is no significant abnormality in the lower chest. There is no
significant musculoskeletal lesion. Moderate facet arthropathy is
present bilaterally at L5-S1.
IMPRESSION: Mild periureteral stranding on the right, perhaps representing
recent stone passage. There may be a 3 mm calculus in the urinary
bladder lumen but no conclusive obstructing ureteral calculus is
present this time. No other significant abnormality is evident.

## 2016-10-22 IMAGING — DX DG CHEST 2V
2 series · 2 of 2 positions shown · non-contrast
Comparison: 10/27/2008

CLINICAL DATA: Hx of htn. She said that her bp went up quite a bit
yesterday when she was told that she was going to have her
gallbladder removed. No other chest complaints. Former smoker.

EXAM:
CHEST  2 VIEW

[chest pa]
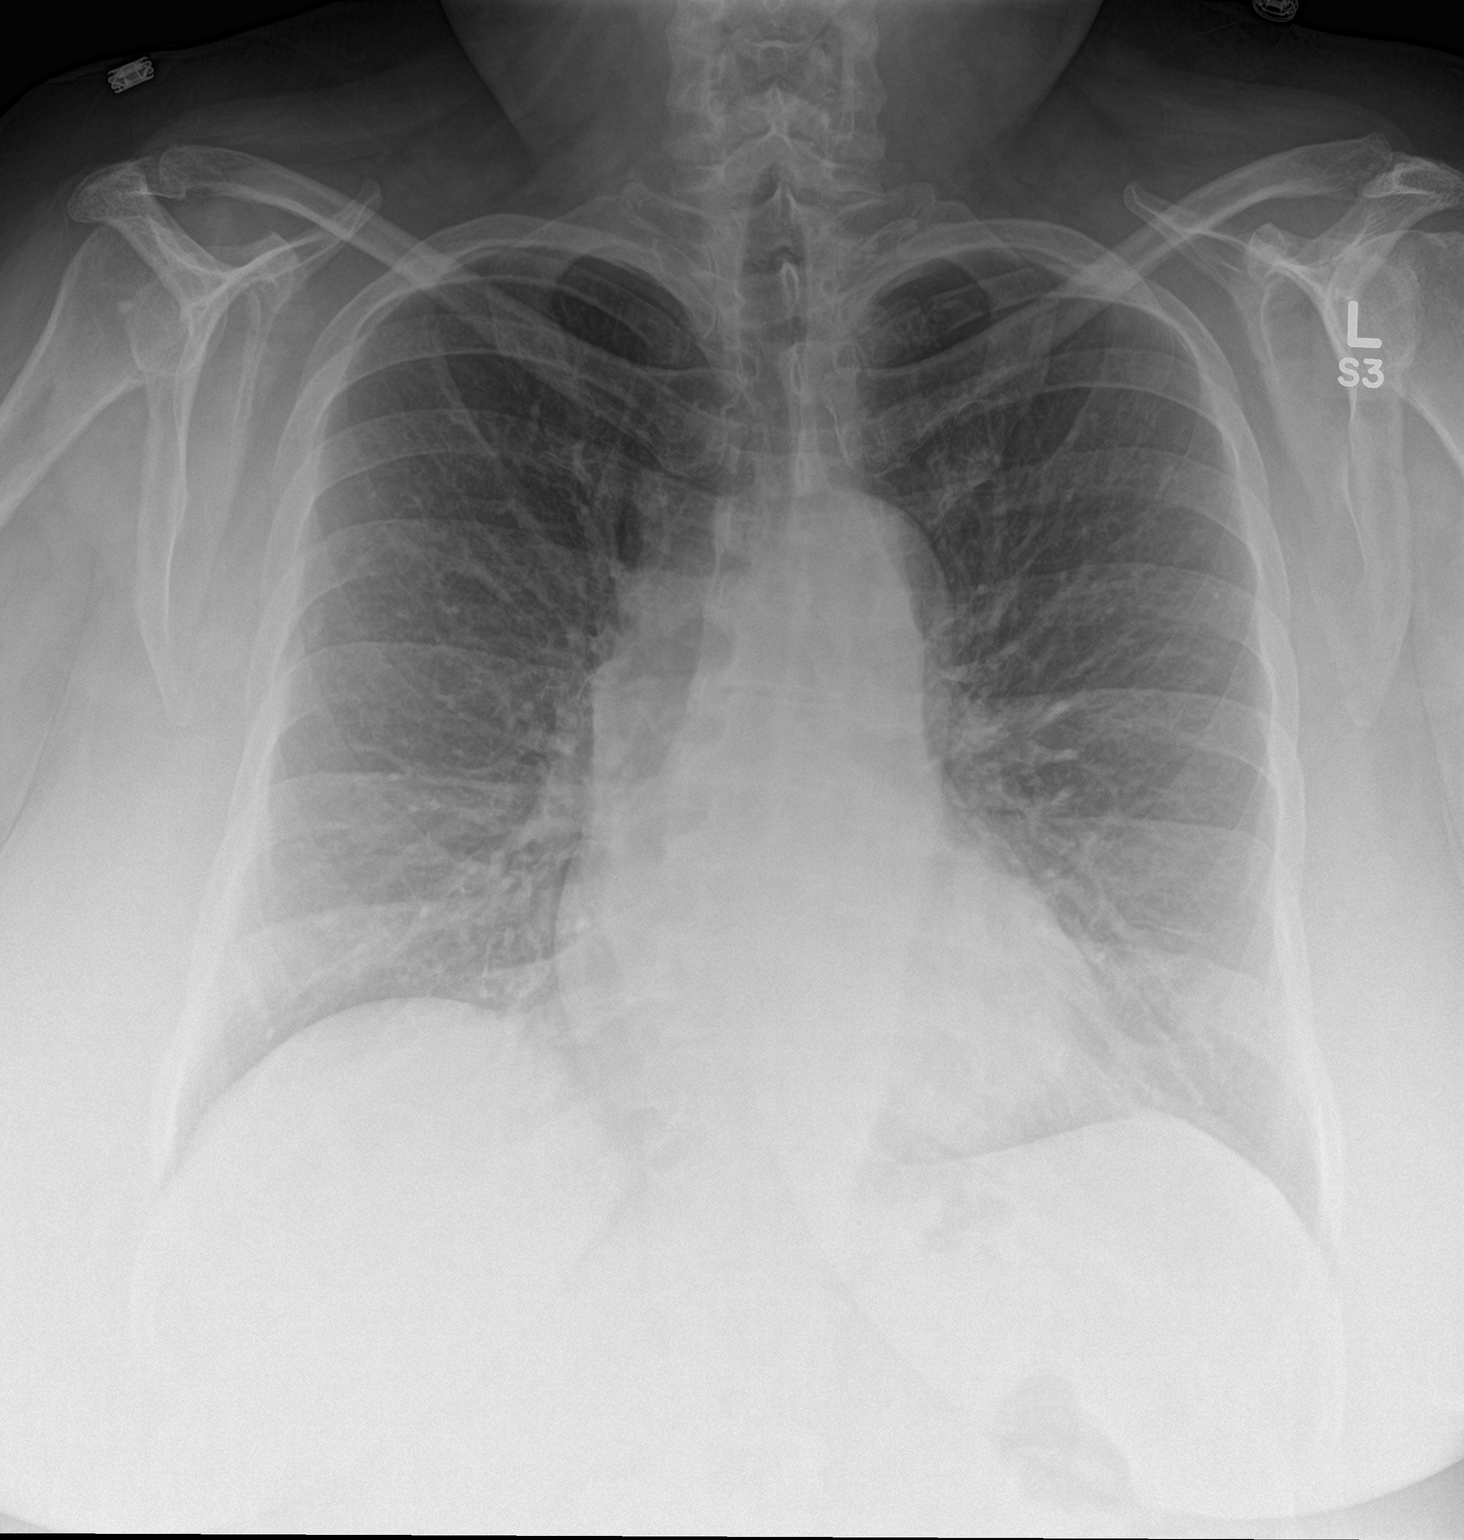

[chest lat]
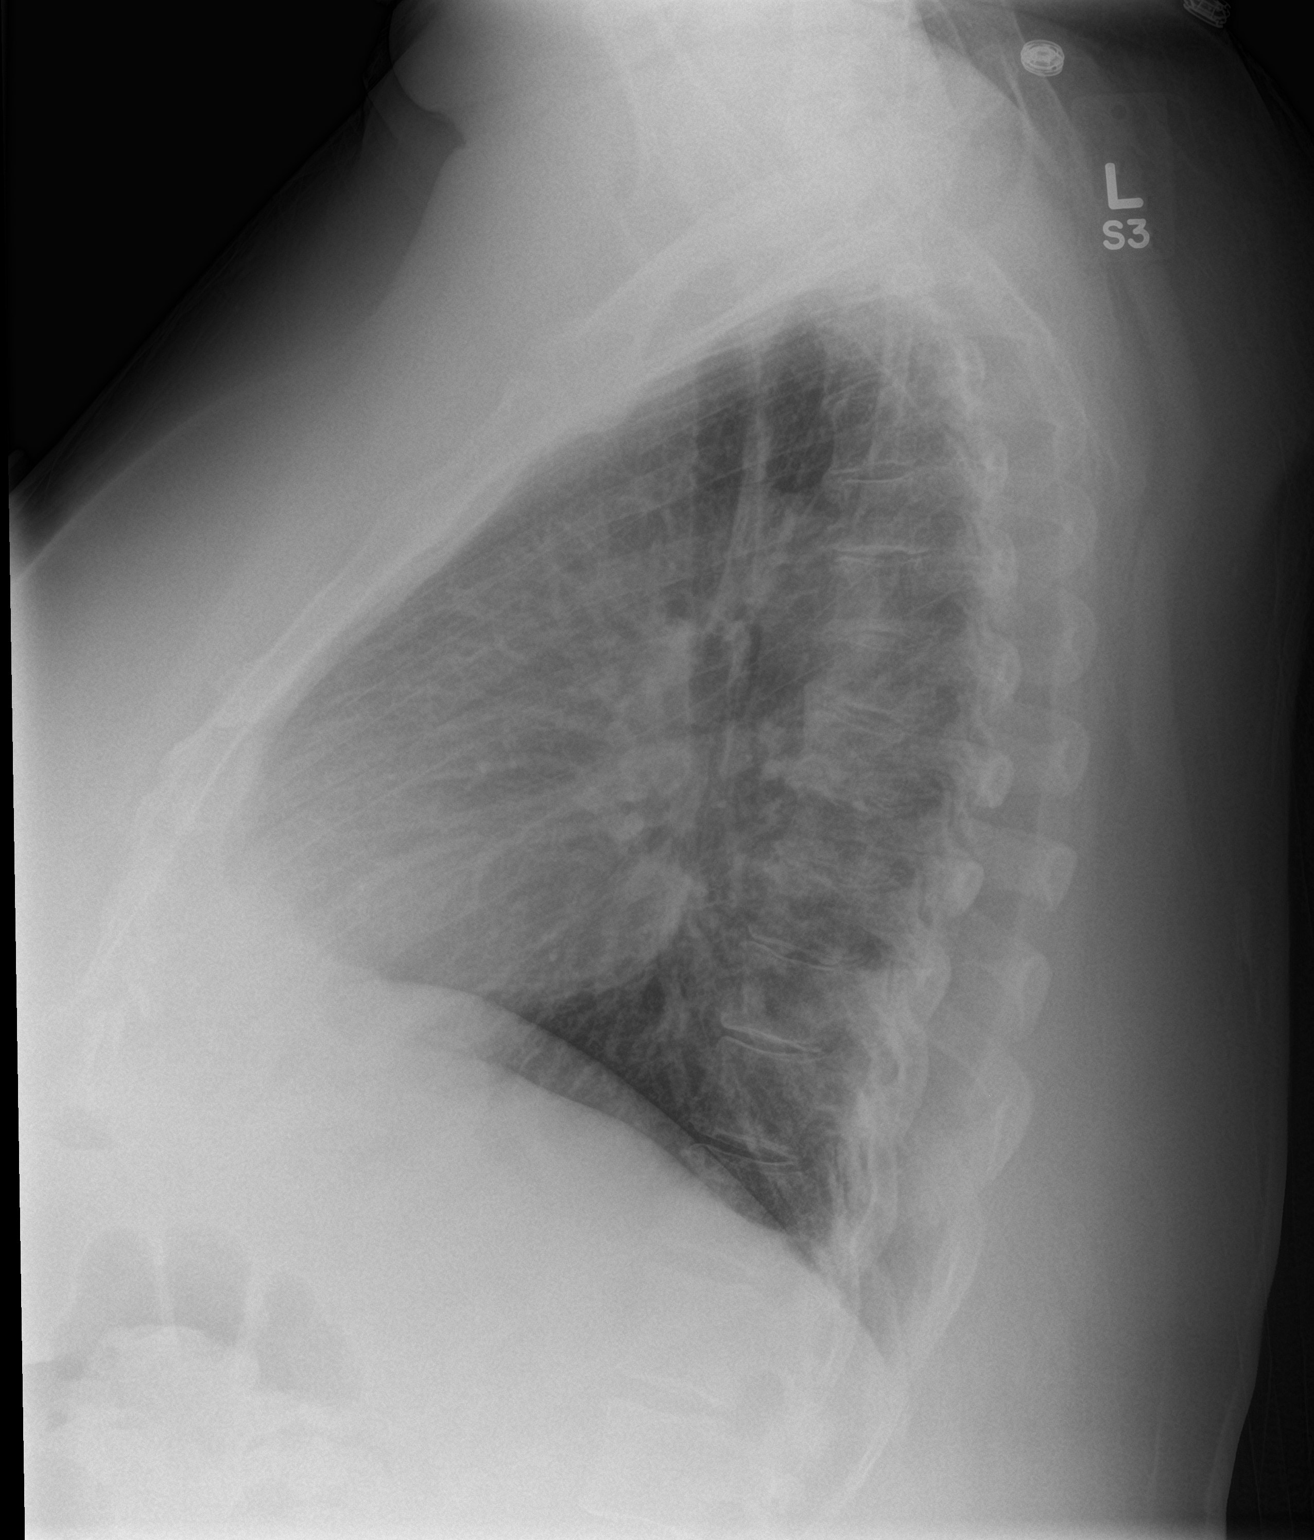

[2 of 2 positions shown; findings below may reference images not displayed]

FINDINGS: There is prominence of interstitial markings, stable in appearance.
Heart size is normal. There are no focal consolidations. No pleural
effusions or pulmonary edema.
IMPRESSION: Stable appearance of the chest.  Prominent interstitial markings.

## 2023-04-16 ENCOUNTER — Other Ambulatory Visit (HOSPITAL_COMMUNITY): Payer: Self-pay | Admitting: Internal Medicine

## 2023-04-16 DIAGNOSIS — Z1231 Encounter for screening mammogram for malignant neoplasm of breast: Secondary | ICD-10-CM

## 2024-10-03 ENCOUNTER — Other Ambulatory Visit (HOSPITAL_COMMUNITY): Payer: Self-pay | Admitting: Internal Medicine

## 2024-10-03 DIAGNOSIS — Z780 Asymptomatic menopausal state: Secondary | ICD-10-CM

## 2024-11-10 ENCOUNTER — Other Ambulatory Visit (HOSPITAL_COMMUNITY): Payer: Self-pay | Admitting: Internal Medicine

## 2024-11-10 DIAGNOSIS — Z1231 Encounter for screening mammogram for malignant neoplasm of breast: Secondary | ICD-10-CM

## 2024-11-19 ENCOUNTER — Inpatient Hospital Stay (HOSPITAL_COMMUNITY): Admission: RE | Admit: 2024-11-19 | Discharge: 2024-11-19 | Attending: Internal Medicine | Admitting: Internal Medicine

## 2024-11-19 DIAGNOSIS — Z1231 Encounter for screening mammogram for malignant neoplasm of breast: Secondary | ICD-10-CM | POA: Diagnosis present

## 2024-11-19 DIAGNOSIS — Z780 Asymptomatic menopausal state: Secondary | ICD-10-CM | POA: Diagnosis present

## 2024-12-09 ENCOUNTER — Encounter (HOSPITAL_COMMUNITY): Payer: Self-pay | Admitting: Internal Medicine

## 2024-12-15 ENCOUNTER — Other Ambulatory Visit (HOSPITAL_COMMUNITY): Payer: Self-pay | Admitting: Internal Medicine

## 2024-12-15 DIAGNOSIS — R928 Other abnormal and inconclusive findings on diagnostic imaging of breast: Secondary | ICD-10-CM

## 2024-12-19 ENCOUNTER — Other Ambulatory Visit: Payer: Self-pay | Admitting: Medical Genetics

## 2024-12-25 ENCOUNTER — Ambulatory Visit (HOSPITAL_COMMUNITY)
Admission: RE | Admit: 2024-12-25 | Discharge: 2024-12-25 | Disposition: A | Discharge status: Home / Self Care | Source: Ambulatory Visit | Attending: Internal Medicine | Admitting: Internal Medicine

## 2024-12-25 ENCOUNTER — Other Ambulatory Visit (HOSPITAL_COMMUNITY): Payer: Self-pay | Admitting: Internal Medicine

## 2024-12-25 ENCOUNTER — Ambulatory Visit (HOSPITAL_COMMUNITY): Admission: RE | Admit: 2024-12-25 | Discharge: 2024-12-25 | Attending: Internal Medicine | Admitting: Internal Medicine

## 2024-12-25 DIAGNOSIS — R928 Other abnormal and inconclusive findings on diagnostic imaging of breast: Secondary | ICD-10-CM

## 2025-01-09 ENCOUNTER — Other Ambulatory Visit: Payer: Self-pay | Admitting: Internal Medicine

## 2025-01-09 DIAGNOSIS — R928 Other abnormal and inconclusive findings on diagnostic imaging of breast: Secondary | ICD-10-CM

## 2025-01-20 ENCOUNTER — Ambulatory Visit (HOSPITAL_COMMUNITY): Admission: RE | Admit: 2025-01-20 | Source: Ambulatory Visit

## 2025-01-29 ENCOUNTER — Other Ambulatory Visit: Payer: Self-pay
